# Patient Record
Sex: Male | Born: 1980 | Race: White | Hispanic: No | Marital: Married | Smoking: Never smoker
Health system: Southern US, Community
[De-identification: ages and names within clinical notes are randomized; demographics above are authoritative.]

---

## 2011-01-06 ENCOUNTER — Ambulatory Visit: Payer: Self-pay | Admitting: Medical

## 2013-08-07 ENCOUNTER — Encounter: Payer: Self-pay | Admitting: Podiatry

## 2013-08-07 ENCOUNTER — Ambulatory Visit (INDEPENDENT_AMBULATORY_CARE_PROVIDER_SITE_OTHER): Payer: Managed Care, Other (non HMO) | Admitting: Podiatry

## 2013-08-07 ENCOUNTER — Ambulatory Visit (INDEPENDENT_AMBULATORY_CARE_PROVIDER_SITE_OTHER): Payer: Managed Care, Other (non HMO)

## 2013-08-07 VITALS — BP 111/68 | HR 55 | Resp 16 | Ht 73.0 in | Wt 225.0 lb

## 2013-08-07 DIAGNOSIS — M722 Plantar fascial fibromatosis: Secondary | ICD-10-CM

## 2013-08-07 MED ORDER — TRIAMCINOLONE ACETONIDE 10 MG/ML IJ SUSP
10.0000 mg | Freq: Once | INTRAMUSCULAR | Status: AC
  Administered 2013-08-07: 10 mg

## 2013-08-07 MED ORDER — DICLOFENAC SODIUM 75 MG PO TBEC: 75.0000 mg | DELAYED_RELEASE_TABLET | Freq: Two times a day (BID) | ORAL | Status: DC

## 2013-08-07 NOTE — Progress Notes (Signed)
   Subjective:    Patient ID: Drew [name], male    DOB: 17-May-1980, 33 y.o.   MRN: [id number]  HPI Comments: "I have pain in the heel"  Patient c/o aching plantar heel left for about 1 year. He does have AM pain and worse after a day of activity. He has iced it-no help. Active runner. Did try OTC inserts-some relief.     Review of Systems  All other systems reviewed and are negative.      Objective:   Physical Exam        Assessment & Plan:

## 2013-08-07 NOTE — Progress Notes (Signed)
Subjective:     Patient ID: Drew [name], male   DOB: 1980/05/13, 33 y.o.   MRN: [id number]  Foot Pain   patient states my left heel has been hurting me quite a bit for the last year and makes it difficult to run and do a lot of different types of exercise   Review of Systems  All other systems reviewed and are negative.      Objective:   Physical Exam  Nursing note and vitals reviewed. Constitutional: He is oriented to person, place, and time.  Musculoskeletal: Normal range of motion.  Neurological: He is oriented to person, place, and time.  Skin: Skin is warm.   neurovascular status intact with muscle strength adequate in range of motion subtalar midtarsal joint within normal limits. Patient is found to have exquisite discomfort at the plantar aspect left heel insertion of the tendon into the calcaneus and mild diminishment of the arch height.     Assessment:     Plantar fasciitis of the left heel with acute inflammation noted    Plan:     H&P and x-rays reviewed. Injected the plantar fascia 3 mg Kenalog 5 mg Xylocaine Marcaine mixture and dispensed fascial brace. Placed on diclofenac 75 mg discussed orthotics and reappoint in 1 week

## 2013-08-07 NOTE — Patient Instructions (Signed)

## 2013-08-14 ENCOUNTER — Ambulatory Visit: Payer: Managed Care, Other (non HMO) | Admitting: Podiatry

## 2013-08-21 ENCOUNTER — Ambulatory Visit (INDEPENDENT_AMBULATORY_CARE_PROVIDER_SITE_OTHER): Payer: Managed Care, Other (non HMO) | Admitting: Podiatry

## 2013-08-21 ENCOUNTER — Encounter: Payer: Self-pay | Admitting: Podiatry

## 2013-08-21 VITALS — BP 112/70 | HR 60 | Resp 16

## 2013-08-21 DIAGNOSIS — M722 Plantar fascial fibromatosis: Secondary | ICD-10-CM

## 2013-08-21 NOTE — Progress Notes (Signed)
Subjective:     Patient ID: Drew [name], male   DOB: 1980/04/03, 33 y.o.   MRN: [id number]  HPI patient states that I'm doing a lot better with the pain but I know I need something for the long-term for my foot. Patient's plantar heel is where he points   Review of Systems     Objective:   Physical Exam Neurovascular status intact with significant diminishment of discomfort left plantar heel upon palpation    Assessment:     Plantar fasciitis left still present but improved    Plan:     Reviewed condition and young age of patient. Patient wants to have this worked on and I do agree that we'll be best for him and I scanned today for custom orthotics to reduce stress against the arch and heel

## 2013-09-19 ENCOUNTER — Other Ambulatory Visit: Payer: Managed Care, Other (non HMO)

## 2013-10-02 ENCOUNTER — Ambulatory Visit: Payer: Managed Care, Other (non HMO) | Admitting: Podiatry

## 2013-10-16 ENCOUNTER — Ambulatory Visit (INDEPENDENT_AMBULATORY_CARE_PROVIDER_SITE_OTHER): Payer: Managed Care, Other (non HMO) | Admitting: Podiatry

## 2013-10-16 ENCOUNTER — Encounter: Payer: Self-pay | Admitting: Podiatry

## 2013-10-16 VITALS — BP 123/70 | HR 58 | Resp 16

## 2013-10-16 DIAGNOSIS — M722 Plantar fascial fibromatosis: Secondary | ICD-10-CM

## 2013-10-16 DIAGNOSIS — T148XXA Other injury of unspecified body region, initial encounter: Secondary | ICD-10-CM

## 2013-10-16 NOTE — Progress Notes (Signed)
Subjective:     Patient ID: Drew SheffieldBlayne Slyter, male   DOB: 08/08/1980, 33 y.o.   MRN: [id number]  HPI patient states his foot was doing fine but then he played soccer and felt a pop in his arch and it has been sore ever since especially in the morning and after periods of sitting   Review of Systems     Objective:   Physical Exam Neurovascular status intact with discomfort in the mid arch area left with obvious loss of integrity of the medial fibers of the plantar fascia    Assessment:     Tear of the plantar fascia secondary to plantarflexion injury which he sustained    Plan:     Reviewed condition and recommended night splint in order to stretch and ice and use while sleeping. Gave instructions that this should take 8-12 weeks to heal but should ultimately heal without any problems

## 2015-05-11 LAB — BASIC METABOLIC PANEL
BUN: 23 — AB (ref 4–21)
CREATININE: 1.5 — AB (ref 0.6–1.3)
GLUCOSE: 86
Potassium: 4.1 (ref 3.4–5.3)
Sodium: 139 (ref 137–147)

## 2015-05-11 LAB — HEPATIC FUNCTION PANEL
ALK PHOS: 66 (ref 25–125)
ALT: 19 (ref 10–40)
AST: 26 (ref 14–40)
BILIRUBIN, TOTAL: 1.4

## 2015-05-11 LAB — LIPID PANEL
Cholesterol: 142 (ref 0–200)
HDL: 54 (ref 35–70)
LDL CALC: 77
Triglycerides: 55 (ref 40–160)

## 2016-05-24 LAB — HEPATIC FUNCTION PANEL
ALT: 25 (ref 10–40)
AST: 26 (ref 14–40)
Alkaline Phosphatase: 65 (ref 25–125)
Bilirubin, Total: 1.4

## 2016-05-24 LAB — LIPID PANEL
CHOLESTEROL: 160 (ref 0–200)
HDL: 52 (ref 35–70)
LDL CALC: 86
Triglycerides: 110 (ref 40–160)

## 2016-05-24 LAB — BASIC METABOLIC PANEL
BUN: 16 (ref 4–21)
Creatinine: 1.1 (ref 0.6–1.3)
GLUCOSE: 85
POTASSIUM: 4.1 (ref 3.4–5.3)
Sodium: 142 (ref 137–147)

## 2017-04-13 ENCOUNTER — Ambulatory Visit: Payer: Self-pay | Admitting: Family Medicine

## 2017-04-13 ENCOUNTER — Ambulatory Visit: Payer: Managed Care, Other (non HMO) | Admitting: Family Medicine

## 2017-04-13 ENCOUNTER — Encounter: Payer: Self-pay | Admitting: Family Medicine

## 2017-04-13 VITALS — BP 128/78 | HR 78 | Temp 97.8°F | Ht 73.5 in | Wt 215.0 lb

## 2017-04-13 DIAGNOSIS — G8929 Other chronic pain: Secondary | ICD-10-CM

## 2017-04-13 DIAGNOSIS — M25511 Pain in right shoulder: Secondary | ICD-10-CM

## 2017-04-13 MED ORDER — DICLOFENAC SODIUM 75 MG PO TBEC: 75.0000 mg | DELAYED_RELEASE_TABLET | Freq: Two times a day (BID) | ORAL | 0 refills | Status: DC

## 2017-04-13 NOTE — Progress Notes (Signed)
Subjective:  Drew [name] is a 37 y.o. male who presents today with a chief complaint of shoulder pain and to establish care  HPI:  Shoulder Pain, New Issue  Symptoms started about 2 years ago.  Have worsened within the last few weeks to months.  Patient has remote history of clavicle fracture twice.  One happened when he was a toddler.  The last happened when he was in high school playing football.  Denies any recent injuries or trauma.  Pain located strictly to his right shoulder.  Pain is intermittent in nature.  Notices the pain with specific maneuvers.  He has tried working on home exercise with resistance tubing which has helped a little bit with his pain.  Has also tried ibuprofen which has not significantly seem to help.  No other obvious precipitating events.  No other obvious alleviating or aggravating factors.  ROS: Per HPI, otherwise a 10 point review of systems was performed and was negative  PMH:  The following were reviewed and entered/updated in epic: History reviewed. No pertinent past medical history. There are no active problems to display for this patient.  History reviewed. No pertinent surgical history.  No family history of bone cancer.   Medications- reviewed and updated Current Outpatient Medications  Medication Sig Dispense Refill  . diclofenac (VOLTAREN) 75 MG EC tablet Take 1 tablet (75 mg total) by mouth 2 (two) times daily. 30 tablet 0   No current facility-administered medications for this visit.    Allergies-reviewed and updated No Known Allergies  Social History   Socioeconomic History  . Marital status: Married    Spouse name: None  . Number of children: 4  . Years of education: None  . Highest education level: None  Social Needs  . Financial resource strain: None  . Food insecurity - worry: None  . Food insecurity - inability: None  . Transportation needs - medical: None  . Transportation needs - non-medical: None  Occupational  History  . None  Tobacco Use  . Smoking status: Never Smoker  . Smokeless tobacco: Never Used  Substance and Sexual Activity  . Alcohol use: Yes  . Drug use: No  . Sexual activity: Yes    Partners: Female  Other Topics Concern  . None  Social History Narrative  . None   Objective:  Physical Exam: BP 128/78 (BP Location: Left Arm, Patient Position: Sitting, Cuff Size: Normal)   Pulse 78   Temp 97.8 F (36.6 C) (Oral)   Ht 6' 1.5" (1.867 m)   Wt 215 lb (97.5 kg)   SpO2 99%   BMI 27.98 kg/m   Gen: NAD, resting comfortably CV: RRR with no murmurs appreciated Pulm: NWOB, CTAB with no crackles, wheezes, or rhonchi GI: Normal bowel sounds present. Soft, Nontender, Nondistended. MSK:  -Neck: No deformities.  Nontender to palpation. -Right shoulder: No deformities.  Tender to palpation along anterior and posterior lateral aspects.  Biceps tendon mildly tender to palpation.  AC joint without tenderness on palpation.  Full range of motion however with pain at 90 degrees and greater of extension.  Normal strength with external rotation.  Internal rotation with mildly decreased strength and pain.  Empty can test negative.  O'Brien's test positive.  Speeds test positive.  Crossover test positive.  Neer negative.  Hawking negative. Skin: Warm, dry Neuro: Grossly normal, moves all extremities Psych: Normal affect and thought content  Assessment/Plan:  Right shoulder pain Concern for labral tear and/or biceps tendinitis based  on his physical exam.  He also may have some component of transient mild subluxation.  He has been diligent about rotator cuff strengthening exercises at home-his rotator cuff seems to be intact based on his exam today.  Discussed treatment options with patient.  He is not sure if he is interested in surgery.  We will proceed with conservative management.  Discussed home exercise program focusing on strengthening rotator cuff musculature.  We will also start 2-week course  of diclofenac.  Patient will follow-up with sports medicine within the next couple of weeks for consideration of intra-articular injection.  Advised patient that he may need MRI for definitive diagnosis.  Return precautions reviewed.  Preventative healthcare Patient will return soon for CPE.  Katina Degree. Jimmey Ralph, MD 04/13/2017 10:28 AM

## 2017-04-13 NOTE — Patient Instructions (Signed)
Start the diclofenac.  Work on the exercises.  Please come back to see Dr Berline Choughigby soon.  Take care, Dr Jimmey RalphParker

## 2017-04-16 ENCOUNTER — Ambulatory Visit: Payer: Managed Care, Other (non HMO) | Admitting: Sports Medicine

## 2017-04-20 ENCOUNTER — Ambulatory Visit: Payer: Managed Care, Other (non HMO) | Admitting: Sports Medicine

## 2017-04-25 ENCOUNTER — Ambulatory Visit (INDEPENDENT_AMBULATORY_CARE_PROVIDER_SITE_OTHER): Payer: Managed Care, Other (non HMO) | Admitting: Family Medicine

## 2017-04-25 ENCOUNTER — Ambulatory Visit (INDEPENDENT_AMBULATORY_CARE_PROVIDER_SITE_OTHER): Payer: Managed Care, Other (non HMO)

## 2017-04-25 ENCOUNTER — Ambulatory Visit (INDEPENDENT_AMBULATORY_CARE_PROVIDER_SITE_OTHER): Payer: Managed Care, Other (non HMO) | Admitting: Sports Medicine

## 2017-04-25 ENCOUNTER — Encounter: Payer: Self-pay | Admitting: Sports Medicine

## 2017-04-25 ENCOUNTER — Encounter: Payer: Self-pay | Admitting: Family Medicine

## 2017-04-25 ENCOUNTER — Ambulatory Visit: Payer: Self-pay

## 2017-04-25 VITALS — BP 110/80 | HR 53 | Ht 73.5 in | Wt 206.0 lb

## 2017-04-25 VITALS — BP 128/80 | HR 63 | Temp 98.2°F | Ht 73.5 in | Wt 206.2 lb

## 2017-04-25 DIAGNOSIS — G8929 Other chronic pain: Secondary | ICD-10-CM

## 2017-04-25 DIAGNOSIS — M25511 Pain in right shoulder: Principal | ICD-10-CM

## 2017-04-25 DIAGNOSIS — M19011 Primary osteoarthritis, right shoulder: Secondary | ICD-10-CM | POA: Diagnosis not present

## 2017-04-25 DIAGNOSIS — Z0000 Encounter for general adult medical examination without abnormal findings: Secondary | ICD-10-CM

## 2017-04-25 DIAGNOSIS — R634 Abnormal weight loss: Secondary | ICD-10-CM

## 2017-04-25 DIAGNOSIS — Z114 Encounter for screening for human immunodeficiency virus [HIV]: Secondary | ICD-10-CM

## 2017-04-25 DIAGNOSIS — Z13220 Encounter for screening for lipoid disorders: Secondary | ICD-10-CM

## 2017-04-25 DIAGNOSIS — M1990 Unspecified osteoarthritis, unspecified site: Secondary | ICD-10-CM | POA: Insufficient documentation

## 2017-04-25 LAB — LIPID PANEL
Cholesterol: 104 mg/dL (ref 0–200)
HDL: 36.7 mg/dL — ABNORMAL LOW (ref >39.00)
LDL CALC: 50 mg/dL (ref 0–99)
NONHDL: 67.5
Total CHOL/HDL Ratio: 3
Triglycerides: 88 mg/dL (ref 0.0–149.0)
VLDL: 17.6 mg/dL (ref 0.0–40.0)

## 2017-04-25 LAB — TSH: TSH: 1.29 u[IU]/mL (ref 0.35–4.50)

## 2017-04-25 LAB — CBC
HCT: 41.9 % (ref 39.0–52.0)
Hemoglobin: 14.3 g/dL (ref 13.0–17.0)
MCHC: 34.2 g/dL (ref 30.0–36.0)
MCV: 93.9 fl (ref 78.0–100.0)
Platelets: 214 10*3/uL (ref 150.0–400.0)
RBC: 4.46 Mil/uL (ref 4.22–5.81)
RDW: 12.9 % (ref 11.5–15.5)
WBC: 3.4 10*3/uL — ABNORMAL LOW (ref 4.0–10.5)

## 2017-04-25 LAB — COMPREHENSIVE METABOLIC PANEL
ALT: 19 U/L (ref 0–53)
AST: 20 U/L (ref 0–37)
Albumin: 4.3 g/dL (ref 3.5–5.2)
Alkaline Phosphatase: 53 U/L (ref 39–117)
BUN: 14 mg/dL (ref 6–23)
CALCIUM: 9.3 mg/dL (ref 8.4–10.5)
CHLORIDE: 106 meq/L (ref 96–112)
CO2: 29 meq/L (ref 19–32)
CREATININE: 1.09 mg/dL (ref 0.40–1.50)
GFR: 80.6 mL/min (ref >60.00)
GLUCOSE: 92 mg/dL (ref 70–99)
Potassium: 4.3 mEq/L (ref 3.5–5.1)
SODIUM: 141 meq/L (ref 135–145)
Total Bilirubin: 0.7 mg/dL (ref 0.2–1.2)
Total Protein: 6.6 g/dL (ref 6.0–8.3)

## 2017-04-25 NOTE — Patient Instructions (Signed)

## 2017-04-25 NOTE — Patient Instructions (Signed)
Preventive Care 18-39 Years, Male Preventive care refers to lifestyle choices and visits with your health care provider that can promote health and wellness. What does preventive care include?  A yearly physical exam. This is also called an annual well check.  Dental exams once or twice a year.  Routine eye exams. Ask your health care provider how often you should have your eyes checked.  Personal lifestyle choices, including: ? Daily care of your teeth and gums. ? Regular physical activity. ? Eating a healthy diet. ? Avoiding tobacco and drug use. ? Limiting alcohol use. ? Practicing safe sex. What happens during an annual well check? The services and screenings done by your health care provider during your annual well check will depend on your age, overall health, lifestyle risk factors, and family history of disease. Counseling Your health care provider may ask you questions about your:  Alcohol use.  Tobacco use.  Drug use.  Emotional well-being.  Home and relationship well-being.  Sexual activity.  Eating habits.  Work and work Statistician.  Screening You may have the following tests or measurements:  Height, weight, and BMI.  Blood pressure.  Lipid and cholesterol levels. These may be checked every 5 years starting at age 34.  Diabetes screening. This is done by checking your blood sugar (glucose) after you have not eaten for a while (fasting).  Skin check.  Hepatitis C blood test.  Hepatitis B blood test.  Sexually transmitted disease (STD) testing.  Discuss your test results, treatment options, and if necessary, the need for more tests with your health care provider. Vaccines Your health care provider may recommend certain vaccines, such as:  Influenza vaccine. This is recommended every year.  Tetanus, diphtheria, and acellular pertussis (Tdap, Td) vaccine. You may need a Td booster every 10 years.  Varicella vaccine. You may need this if you  have not been vaccinated.  HPV vaccine. If you are 23 or younger, you may need three doses over 6 months.  Measles, mumps, and rubella (MMR) vaccine. You may need at least one dose of MMR.You may also need a second dose.  Pneumococcal 13-valent conjugate (PCV13) vaccine. You may need this if you have certain conditions and have not been vaccinated.  Pneumococcal polysaccharide (PPSV23) vaccine. You may need one or two doses if you smoke cigarettes or if you have certain conditions.  Meningococcal vaccine. One dose is recommended if you are age 65-21 years and a first-year college student living in a residence hall, or if you have one of several medical conditions. You may also need additional booster doses.  Hepatitis A vaccine. You may need this if you have certain conditions or if you travel or work in places where you may be exposed to hepatitis A.  Hepatitis B vaccine. You may need this if you have certain conditions or if you travel or work in places where you may be exposed to hepatitis B.  Haemophilus influenzae type b (Hib) vaccine. You may need this if you have certain risk factors.  Talk to your health care provider about which screenings and vaccines you need and how often you need them. This information is not intended to replace advice given to you by your health care provider. Make sure you discuss any questions you have with your health care provider. Document Released: 04/25/2001 Document Revised: 11/17/2015 Document Reviewed: 12/29/2014 Elsevier Interactive Patient Education  [employer].

## 2017-04-25 NOTE — Procedures (Signed)
PROCEDURE NOTE:  Ultrasound Guided: Injection: Right shoulder Images were obtained and interpreted by myself, Gaspar BiddingMichael Zekiah Coen, DO  Images have been saved and stored to PACS system. Images obtained on: GE S7 Ultrasound machine  ULTRASOUND FINDINGS:  Biceps tendon is intact.  Well-defined musculature including within all rotator cuff musculature.  Small amount of degenerative spurring, questionable small posterior labral tear incompletely visualized on MSK ultrasound  DESCRIPTION OF PROCEDURE:  The patient's clinical condition is marked by substantial pain and/or significant functional disability. Other conservative therapy has not provided relief, is contraindicated, or not appropriate. There is a reasonable likelihood that injection will significantly improve the patient's pain and/or functional impairment.  After discussing the risks, benefits and expected outcomes of the injection and all questions were reviewed and answered, the patient wished to undergo the above named procedure. Verbal consent was obtained.  The ultrasound was used to identify the target structure and adjacent neurovascular structures. The skin was then prepped in sterile fashion and the target structure was injected under direct visualization using sterile technique as below:   PREP: Alcohol, Ethel Chloride,   APPROACH: posterior, single injection, 21g 2in. INJECTATE: 2cc: 0.5% marcaine, 2cc: 40mg /mL DepoMedrol   ASPIRATE: None   DRESSING: Band-Aid   Post procedural instructions including recommending icing and warning signs for infection were reviewed.   This procedure was well tolerated and there were no complications.     IMPRESSION: Succesful Ultrasound Guided: Injection

## 2017-04-25 NOTE — Progress Notes (Signed)
Veverly FellsMichael D. Delorise Shinerigby, DO  [facility] [place] [phone]  Drew SheffieldBlayne Duve - 37 y.o. male MRN [id number]  Date of birth: 10/23/1980  Visit Date: 04/25/2017  PCP: Ardith DarkParker, Caleb M, MD   Referred by: Ardith DarkParker, Caleb M, MD   Scribe for today's visit: Stevenson ClinchBrandy Coleman, CMA     SUBJECTIVE:  Drew [name] is here for No chief complaint on file.  Marland Kitchen.[email or web address] RT shoulder pain symptoms INITIALLY: Began about 2 yrs ago but has gotten worse over the past 1-2 months. No recent injury/truama. He is LT hand dominant.  Described as moderate stabbing, nonradiating Worsened with rotation, rolling over in bed, picking up heavy objects.  Improved with rest. Additional associated symptoms include: Pain is mostly posterior. He hears and feels clicking and popping in the shoulder with movement. He denies decreased grip strength.     At this time symptoms are worsening compared to onset  He has been taking IBU with no relief. He was prescribed Diclofenac by Dr. Jimmey RalphParker but has not started it. He was also provided with home therapeutic exercises which don't seem to be helping.   No recent xray of neck or shoulder.    ROS Reports night time disturbances. Denies fevers, chills, or night sweats. Denies unexplained weight loss. Denies personal history of cancer. Denies changes in bowel or bladder habits. Denies recent unreported falls. Denies new or worsening dyspnea or wheezing. Denies headaches or dizziness.  Denies numbness, tingling or weakness  In the extremities.  Denies dizziness or presyncopal episodes Denies lower extremity edema     HISTORY & PERTINENT PRIOR DATA:  Prior History reviewed and updated per electronic medical record.  Significant history, findings, studies and interim changes include:  reports that  has never smoked. he has never used smokeless tobacco. No results for input(s): HGBA1C, LABURIC, CREATINE in the last 8760 hours. Left hand  dominant Problem  Osteoarthritis of Right Shoulder   Steroid injection on 04/25/2017 Prior clavicle fracture playing football but no other known recurrent dislocation/relocation injuries. If any lack of improvement will need further diagnostic evaluation with MR arthrogram of the shoulder.     OBJECTIVE:  VS:  HT:6' 1.5" (186.7 cm)   WT:206 lb (93.4 kg)  BMI:26.81    BP:110/80  HR:(!) 53bpm  TEMP: ( )  RESP:98 %   PHYSICAL EXAM: Constitutional: WDWN, Non-toxic appearing. Psychiatric: Alert & appropriately interactive.  Not depressed or anxious appearing. Respiratory: No increased work of breathing.  Trachea Midline Eyes: Pupils are equal.  EOM intact without nystagmus.  No scleral icterus  NEUROVASCULAR exam: No clubbing or cyanosis appreciated No significant venous stasis changes Capillary Refill: normal, less than 2 seconds   Right Shoulder Exam: Normal alignment & Contours Skin: No overlying erythema/ecchymosis Neck: normal range of motion and supple Axial loading and circumduction produces: Small amount of crepitation and small amount of pain especially AB adducted and externally rotated position  Non tender to palpation over: Bony Landmarks TTP over: none Drop arm test: negative Hawkins: normal, no pain  Neers: normal, no pain   Internal Rotation:  ROM: Normal with no pain Strength: Normal External Rotation:  ROM: Normal with no pain Strength: Normal  Empty can: normal, no pain Strength: Normal Speeds: normal, no pain Strength: Normal O'Briens: normal, no pain Strength: Normal  No additional findings.   ASSESSMENT & PLAN:   1. Chronic right shoulder pain   2. Osteoarthritis of right shoulder, unspecified osteoarthritis type    PLAN:  Osteoarthritis of right shoulder Steroid injection today Prior clavicle fracture playing football but no other known recurrent dislocation/relocation injuries. If any lack of improvement will need further diagnostic  evaluation with MR arthrogram of the shoulder Possibly underlying labral tear   ++++++++++++++++++++++++++++++++++++++++++++ Orders & Meds: Orders Placed This Encounter  Procedures  . DG Shoulder Right  . [place] MSK POCT ULTRASOUND    No orders of the defined types were placed in this encounter.   ++++++++++++++++++++++++++++++++++++++++++++ Follow-up: Return in about 6 weeks (around 06/06/2017).   Pertinent documentation may be included in additional procedure notes, imaging studies, problem based documentation and patient instructions. Please see these sections of the encounter for additional information regarding this visit. CMA/ATC served as Neurosurgeon during this visit. History, Physical, and Plan performed by medical provider. Documentation and orders reviewed and attested to.      Andrena Mews, DO    [facility] Physician

## 2017-04-25 NOTE — Assessment & Plan Note (Signed)
Steroid injection today Prior clavicle fracture playing football but no other known recurrent dislocation/relocation injuries. If any lack of improvement will need further diagnostic evaluation with MR arthrogram of the shoulder Possibly underlying labral tear

## 2017-04-25 NOTE — Progress Notes (Signed)
Subjective:  Drew [name] is a 37 y.o. male who presents today for his annual comprehensive physical exam.    HPI:  He has no acute complaints today.   Lifestyle Diet: Trying to stay away from fast food.  Exercise: Works out 5-6 days per week. Cardio and weight lifting.   Depression screen PHQ 2/9 04/25/2017  Decreased Interest 0  Down, Depressed, Hopeless 0  PHQ - 2 Score 0   Health Maintenance Due  Topic Date Due  . HIV Screening  08/19/1994    ROS: A 10 point review of systems was performed and was negative  PMH:  The following were reviewed and entered/updated in epic: History reviewed. No pertinent past medical history. There are no active problems to display for this patient.  History reviewed. No pertinent surgical history.  No FH of hypertension.   Medications- reviewed and updated No current outpatient medications on file.   No current facility-administered medications for this visit.    Allergies-reviewed and updated No Known Allergies  Social History   Socioeconomic History  . Marital status: Married    Spouse name: None  . Number of children: 4  . Years of education: None  . Highest education level: None  Social Needs  . Financial resource strain: None  . Food insecurity - worry: None  . Food insecurity - inability: None  . Transportation needs - medical: None  . Transportation needs - non-medical: None  Occupational History  . None  Tobacco Use  . Smoking status: Never Smoker  . Smokeless tobacco: Never Used  Substance and Sexual Activity  . Alcohol use: Yes  . Drug use: No  . Sexual activity: Yes    Partners: Female  Other Topics Concern  . None  Social History Narrative  . None    Objective:  Physical Exam: BP 128/80 (BP Location: Left Arm, Patient Position: Sitting, Cuff Size: Normal)   Pulse 63   Temp 98.2 F (36.8 C) (Oral)   Ht 6' 1.5" (1.867 m)   Wt 206 lb 3.2 oz (93.5 kg)   SpO2 98%   BMI 26.84 kg/m   Body mass  index is 26.84 kg/m. Wt Readings from Last 3 Encounters:  04/25/17 206 lb 3.2 oz (93.5 kg)  04/13/17 215 lb (97.5 kg)  08/07/13 225 lb (102.1 kg)   Gen: NAD, resting comfortably HEENT: TMs normal bilaterally. OP clear. No thyromegaly noted.  CV: RRR with no murmurs appreciated Pulm: NWOB, CTAB with no crackles, wheezes, or rhonchi GI: Normal bowel sounds present. Soft, Nontender, Nondistended. MSK: no edema, cyanosis, or clubbing noted Skin: warm, dry Neuro: CN2-12 grossly intact. Strength 5/5 in upper and lower extremities. Reflexes symmetric and intact bilaterally.  Psych: Normal affect and thought content  Assessment/Plan:  Weight Loss Patient down about 9lb over the last 2 weeks. Check CBC, CMET, TSH  Preventative Healthcare: Check lipid panel. Check HIV antibody.   Patient Counseling:  -Nutrition: Stressed importance of moderation in sodium/caffeine intake, saturated fat and cholesterol, caloric balance, sufficient intake of fresh fruits, vegetables, and fiber.  -Stressed the importance of regular exercise.   -Substance Abuse: Discussed cessation/primary prevention of tobacco, alcohol, or other drug use; driving or other dangerous activities under the influence; availability of treatment for abuse.   -Injury prevention: Discussed safety belts, safety helmets, smoke detector, smoking near bedding or upholstery.   -Sexuality: Discussed sexually transmitted diseases, partner selection, use of condoms, avoidance of unintended pregnancy and contraceptive alternatives.   -Dental health: Discussed importance  of regular tooth brushing, flossing, and dental visits.  -Health maintenance and immunizations reviewed. Please refer to Health maintenance section.  Return to care in 1 year for next preventative visit.   Katina Degree. Jimmey Ralph, MD 04/25/2017 9:19 AM

## 2017-04-26 ENCOUNTER — Encounter: Payer: Self-pay | Admitting: Family Medicine

## 2017-04-26 LAB — HIV ANTIBODY (ROUTINE TESTING W REFLEX): HIV 1&2 Ab, 4th Generation: NONREACTIVE

## 2017-04-26 NOTE — Progress Notes (Signed)
Findings are consistent with osteoarthritis of the right shoulder that is mild to moderate

## 2017-06-06 ENCOUNTER — Ambulatory Visit (INDEPENDENT_AMBULATORY_CARE_PROVIDER_SITE_OTHER): Payer: Managed Care, Other (non HMO) | Admitting: Sports Medicine

## 2017-06-06 ENCOUNTER — Encounter: Payer: Self-pay | Admitting: Sports Medicine

## 2017-06-06 VITALS — BP 102/68 | HR 65 | Ht 73.5 in | Wt 208.4 lb

## 2017-06-06 DIAGNOSIS — M19011 Primary osteoarthritis, right shoulder: Secondary | ICD-10-CM

## 2017-06-06 DIAGNOSIS — M25511 Pain in right shoulder: Secondary | ICD-10-CM

## 2017-06-06 DIAGNOSIS — G8929 Other chronic pain: Secondary | ICD-10-CM | POA: Diagnosis not present

## 2017-06-06 NOTE — Assessment & Plan Note (Signed)
He has noted good improvement over the past several weeks with decreasing his overhead lifting activities.  Continuing with activity modifications at this time and he will plan to follow-up with us as needed if recurrence of significant symptoms otherwise further diagnostic evaluation will be deferred given that he would like to avoid surgery if possible and there are no red flag symptoms.

## 2017-06-06 NOTE — Progress Notes (Signed)
Drew FellsMichael D. Drew Shinerigby, DO  [facility] [place] [phone]  Drew SheffieldBlayne [name] - 37 y.o. male MRN [id number]  Date of birth: 09/07/1980  Visit Date: 06/06/2017  PCP: Drew [name], Drew [name], Drew [name]   Referred by: Drew [name], Drew [name], Drew [name]  Scribe for today's visit: Drew [name], CMA     SUBJECTIVE:  Drew SheffieldBlayne [name] is here for Follow-up (R shoulder pain)  04/25/17: His RT shoulder pain symptoms INITIALLY: Began about 2 yrs ago but has gotten worse over the past 1-2 months. No recent injury/truama. He is LT hand dominant.  Described as moderate stabbing, nonradiating Worsened with rotation, rolling over in bed, picking up heavy objects.  Improved with rest. Additional associated symptoms include: Pain is mostly posterior. He hears and feels clicking and popping in the shoulder with movement. He denies decreased grip strength.  At this time symptoms are worsening compared to onset  He has been taking IBU with no relief. He was prescribed Diclofenac by Dr. Jimmey RalphParker but has not started it. He was also provided with home therapeutic exercises which don't seem to be helping.  No recent xray of neck or shoulder.   06/06/17: Compared to the last office visit, his previously described symptoms show no change. He had initial improvement after injection but still has pain when he uses the arm.  Current symptoms are moderate & are nonradiating He had previously tried IBU and Diclofenac with minimal relief. He has been doing home exercises as a warm up prior to working out. He had steroid injection 04/25/17 and got some relief.   ROS Denies night time disturbances. Denies fevers, chills, or night sweats. Denies unexplained weight loss. Denies personal history of cancer. Denies changes in bowel or bladder habits. Denies recent unreported falls. Denies new or worsening dyspnea or wheezing. Denies headaches or dizziness.  Denies numbness, tingling or weakness  In the extremities.   Denies dizziness or presyncopal episodes Denies lower extremity edema    HISTORY & PERTINENT PRIOR DATA:  Prior History reviewed and updated per electronic medical record.  Significant/pertinent history, findings, studies include:  reports that he has never smoked. He has never used smokeless tobacco. No results for input(s): HGBA1C, LABURIC, CREATINE in the last 8760 hours. Left hand dominant Problem  Osteoarthritis of Right Shoulder   Steroid injection on 04/25/2017 Prior clavicle fracture playing football but no other known recurrent dislocation/relocation injuries. If any lack of improvement will need further diagnostic evaluation with MR arthrogram of the shoulder.     OBJECTIVE:  VS:  HT:6' 1.5" (186.7 cm)   WT:208 lb 6.4 oz (94.5 kg)  BMI:27.12    BP:102/68  HR:65bpm  TEMP: ( )  RESP:99 %   PHYSICAL EXAM: Constitutional: WDWN, Non-toxic appearing. Psychiatric: Alert & appropriately interactive.  Not depressed or anxious appearing. Respiratory: No increased work of breathing.  Trachea Midline Eyes: Pupils are equal.  EOM intact without nystagmus.  No scleral icterus  VASCULAR: warm to touch Radial Pulses: normal upper extremity neuro exam: unremarkable  Is a shoulder is held in protraction quite significantly bilaterally.  He does have full overhead range of motion.  No significant crepitation with axial load and circumduction.  Strength with internal rotation, empty can, speeds testing and O'Brien's testing is all 5 out of 5 with no pain today.  He has normal range of motion of his neck.  No radicular symptoms.     ASSESSMENT & PLAN:   1. Chronic right shoulder pain   2.  Osteoarthritis of right shoulder, unspecified osteoarthritis type     PLAN:       Follow-up: Return if symptoms worsen or fail to improve, for as needed for ongoing issues.   Osteoarthritis of right shoulder He has noted good improvement over the past several weeks with decreasing his  overhead lifting activities.  Continuing with activity modifications at this time and he will plan to follow-up with [place] as needed if recurrence of significant symptoms otherwise further diagnostic evaluation will be deferred given that he would like to avoid surgery if possible and there are no red flag symptoms.    Please see additional documentation for Objective, Assessment and Plan sections. Pertinent additional documentation may be included in corresponding procedure notes, imaging studies, problem based documentation and patient instructions. Please see these sections of the encounter for additional information regarding this visit.  CMA/ATC served as Neurosurgeon during this visit. History, Physical, and Plan performed by medical provider. Documentation and orders reviewed and attested to.      Drew Mews, DO    [facility] Physician

## 2017-09-27 ENCOUNTER — Ambulatory Visit: Payer: Managed Care, Other (non HMO) | Admitting: Family Medicine

## 2017-09-27 ENCOUNTER — Encounter: Payer: Self-pay | Admitting: Family Medicine

## 2017-09-27 VITALS — BP 116/64 | HR 78 | Temp 97.8°F | Ht 74.0 in | Wt 204.6 lb

## 2017-09-27 DIAGNOSIS — L237 Allergic contact dermatitis due to plants, except food: Secondary | ICD-10-CM | POA: Diagnosis not present

## 2017-09-27 MED ORDER — PREDNISONE 20 MG PO TABS: ORAL_TABLET | ORAL | 0 refills | Status: AC

## 2017-09-27 NOTE — Progress Notes (Signed)
   Subjective:  Drew [name] is a 37 y.o. male who presents today for same-day appointment with a chief complaint of rash.   HPI:  Rash, Acute problem Started a few weeks ago. Worsened over that time.  He was clearing brush and thinks he was exposed to poison ivy.  Rash initially on his left arm however progressed to his trunk and right arm.  Also some of his genital areas.  He hass tried calamine lotion and treat heal well without significant relief.  No other obvious alleviating or aggravating factors.  ROS: Per HPI  PMH: He reports that he has never smoked. He has never used smokeless tobacco. He reports that he drinks alcohol. He reports that he does not use drugs.  Objective:  Physical Exam: BP 116/64 (BP Location: Right Arm, Patient Position: Sitting, Cuff Size: Normal)   Pulse 78   Temp 97.8 F (36.6 C) (Oral)   Ht 6\' 2"  (1.88 m)   Wt 204 lb 9.6 oz (92.8 kg)   SpO2 98%   BMI 26.27 kg/m   Gen: NAD, resting comfortably Skin: Erythematous, vesicular rash on left upper extremity, right upper extremity, and abdomen.  Assessment/Plan:  Poison ivy dermatitis Given widespread distribution, we will start prednisone taper.  Also recommended use of over-the-counter hydrocortisone cream to the areas on extremities and trunk.  Discussed reasons to return to care.  Follow-up as needed.  Katina Degreealeb M. Jimmey RalphParker, MD 09/27/2017 1:25 PM

## 2017-09-27 NOTE — Patient Instructions (Signed)
It was very nice to see you today!  Please start the prednisone.  Take 2 pills daily for the first 7 days, then 1 pill daily for the next 3 days.  You can also use over-the-counter hydrocortisone cream to the areas on the arms and abdomen.  Please let me know if your symptoms worsen or do not improve with medications.  Take care, Dr Jimmey RalphParker

## 2018-06-26 ENCOUNTER — Telehealth: Payer: Self-pay | Admitting: Family Medicine

## 2018-06-26 NOTE — Telephone Encounter (Signed)
Called pt and left vm informing of doxy

## 2018-07-01 ENCOUNTER — Telehealth: Payer: Self-pay | Admitting: Family Medicine

## 2018-07-01 NOTE — Telephone Encounter (Signed)
Patient called requesting a call back because he needs to speak with someone about his poison oak. [phone] (mobile)

## 2018-07-01 NOTE — Telephone Encounter (Signed)
Schedule doxy appt to speak to Dr. Elisha Headland to discuss

## 2018-12-02 IMAGING — DX DG SHOULDER 2+V*R*
3 series · 3 of 3 positions shown · non-contrast
Comparison: None.

CLINICAL DATA: Chronic right shoulder pain.

EXAM:
RIGHT SHOULDER - 2+ VIEW

[shoulder grashey ap]
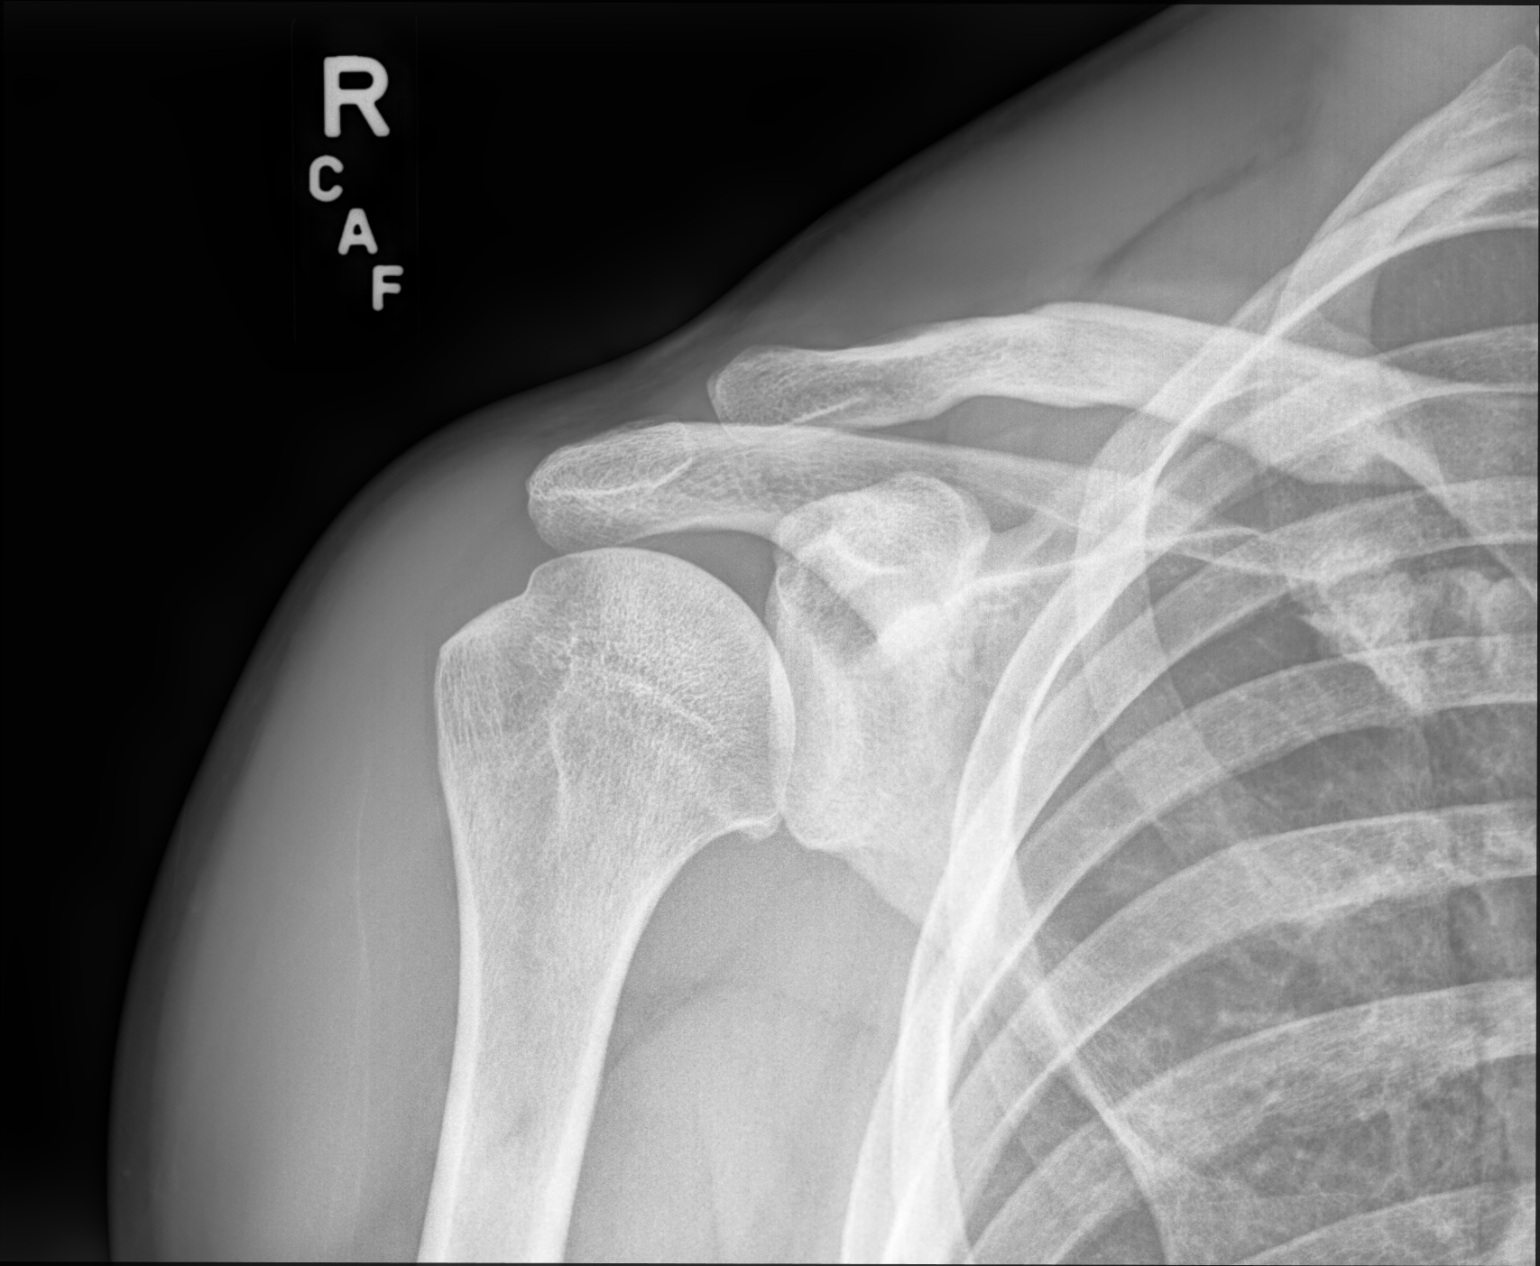

[shoulder y view]
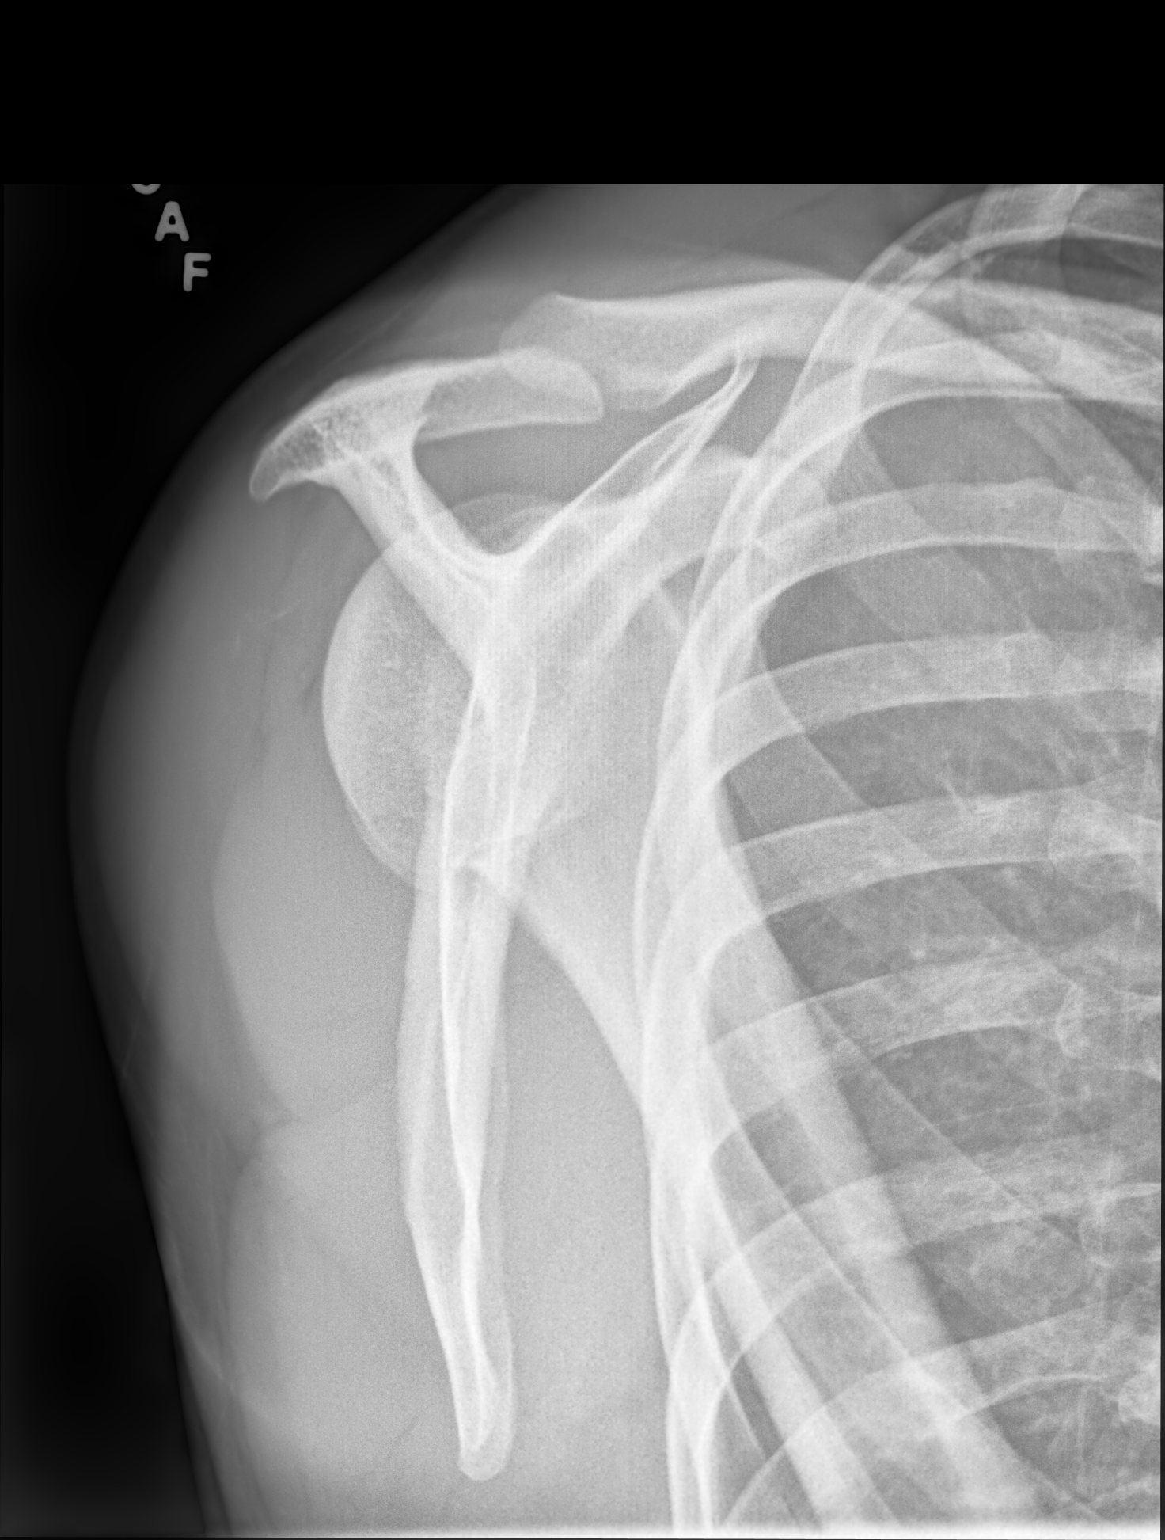

[shoulder axial]
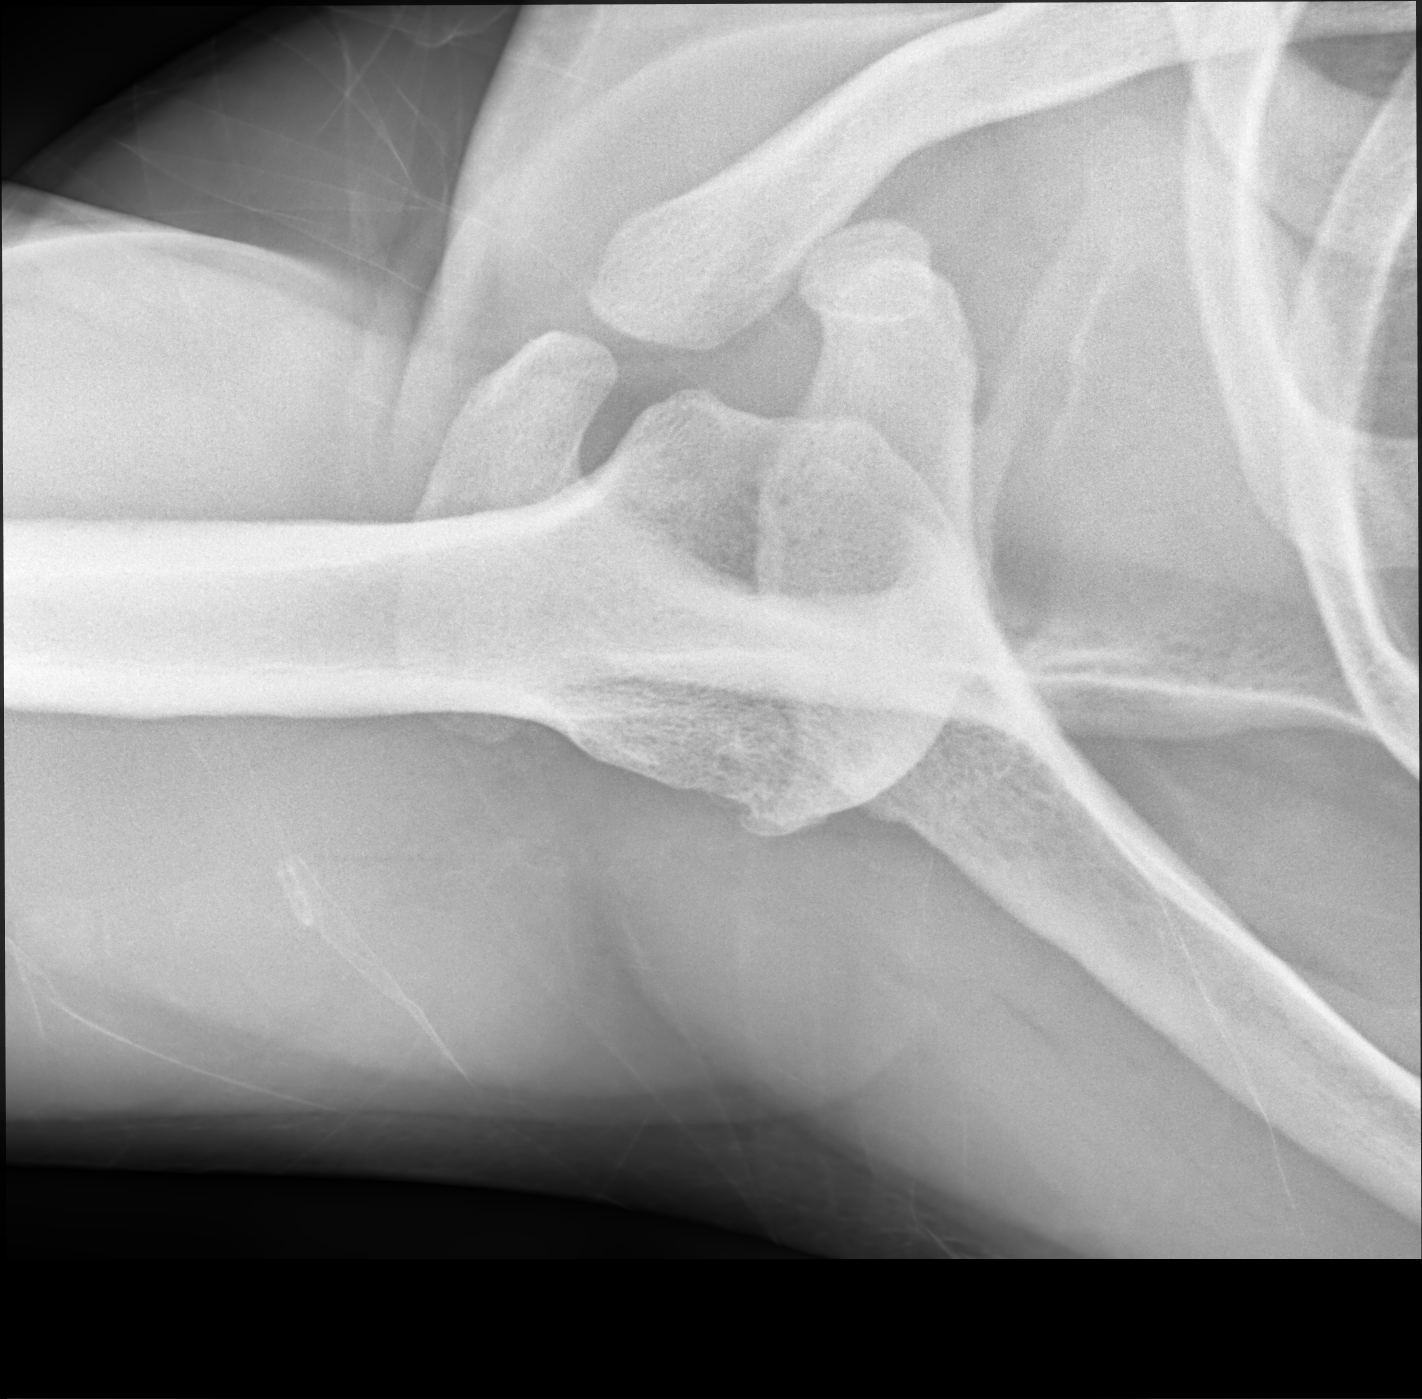

[3 of 3 positions shown; findings below may reference images not displayed]

FINDINGS: The joint spaces are maintained. Possible minimal/early spurring
change. No acute bony findings or bone lesion. No abnormal soft
tissue calcifications. The visualized lung is clear and the
visualized ribs are intact.
IMPRESSION: No acute bony findings.

Suspect early spurring change.

## 2019-05-26 ENCOUNTER — Other Ambulatory Visit: Payer: Self-pay

## 2019-05-26 ENCOUNTER — Encounter: Payer: Self-pay | Admitting: Family Medicine

## 2019-05-26 ENCOUNTER — Ambulatory Visit (INDEPENDENT_AMBULATORY_CARE_PROVIDER_SITE_OTHER): Payer: Managed Care, Other (non HMO) | Admitting: Family Medicine

## 2019-05-26 VITALS — BP 118/70 | HR 78 | Temp 98.2°F | Ht 74.0 in | Wt 221.0 lb

## 2019-05-26 DIAGNOSIS — E663 Overweight: Secondary | ICD-10-CM

## 2019-05-26 DIAGNOSIS — B360 Pityriasis versicolor: Secondary | ICD-10-CM

## 2019-05-26 DIAGNOSIS — Z0001 Encounter for general adult medical examination with abnormal findings: Secondary | ICD-10-CM

## 2019-05-26 DIAGNOSIS — Z6828 Body mass index (BMI) 28.0-28.9, adult: Secondary | ICD-10-CM | POA: Diagnosis not present

## 2019-05-26 MED ORDER — KETOCONAZOLE 2 % EX SHAM: 1.0000 "application " | MEDICATED_SHAMPOO | CUTANEOUS | 0 refills | Status: DC

## 2019-05-26 NOTE — Assessment & Plan Note (Signed)
Stable.  Refilled ketoconazole.

## 2019-05-26 NOTE — Patient Instructions (Signed)
It was very nice to see you today!  We will send in ketoconazole for your tinea versicolor.  No other changes today.  Please come back in year for your next physical, or sooner if needed.  Take care, Dr Jerline Pain  Please try these tips to maintain a healthy lifestyle:   Eat at least 3 REAL meals and 1-2 snacks per day.  Aim for no more than 5 hours between eating.  If you eat breakfast, please do so within one hour of getting up.    Each meal should contain half fruits/vegetables, one quarter protein, and one quarter carbs (no bigger than a computer mouse)   Cut down on sweet beverages. This includes juice, soda, and sweet tea.     Drink at least 1 glass of water with each meal and aim for at least 8 glasses per day   Exercise at least 150 minutes every week.    Preventive Care 40-40 Years Old, Male Preventive care refers to lifestyle choices and visits with your health care provider that can promote health and wellness. This includes:  A yearly physical exam. This is also called an annual well check.  Regular dental and eye exams.  Immunizations.  Screening for certain conditions.  Healthy lifestyle choices, such as eating a healthy diet, getting regular exercise, not using drugs or products that contain nicotine and tobacco, and limiting alcohol use. What can I expect for my preventive care visit? Physical exam Your health care provider will check:  Height and weight. These may be used to calculate body mass index (BMI), which is a measurement that tells if you are at a healthy weight.  Heart rate and blood pressure.  Your skin for abnormal spots. Counseling Your health care provider may ask you questions about:  Alcohol, tobacco, and drug use.  Emotional well-being.  Home and relationship well-being.  Sexual activity.  Eating habits.  Work and work Statistician. What immunizations do I need?  Influenza (flu) vaccine  This is recommended every  year. Tetanus, diphtheria, and pertussis (Tdap) vaccine  You may need a Td booster every 10 years. Varicella (chickenpox) vaccine  You may need this vaccine if you have not already been vaccinated. Human papillomavirus (HPV) vaccine  If recommended by your health care provider, you may need three doses over 6 months. Measles, mumps, and rubella (MMR) vaccine  You may need at least one dose of MMR. You may also need a second dose. Meningococcal conjugate (MenACWY) vaccine  One dose is recommended if you are 12-64 years old and a Market researcher living in a residence hall, or if you have one of several medical conditions. You may also need additional booster doses. Pneumococcal conjugate (PCV13) vaccine  You may need this if you have certain conditions and were not previously vaccinated. Pneumococcal polysaccharide (PPSV23) vaccine  You may need one or two doses if you smoke cigarettes or if you have certain conditions. Hepatitis A vaccine  You may need this if you have certain conditions or if you travel or work in places where you may be exposed to hepatitis A. Hepatitis B vaccine  You may need this if you have certain conditions or if you travel or work in places where you may be exposed to hepatitis B. Haemophilus influenzae type b (Hib) vaccine  You may need this if you have certain risk factors. You may receive vaccines as individual doses or as more than one vaccine together in one shot (combination vaccines). Talk with  your health care provider about the risks and benefits of combination vaccines. What tests do I need? Blood tests  Lipid and cholesterol levels. These may be checked every 5 years starting at age 26.  Hepatitis C test.  Hepatitis B test. Screening   Diabetes screening. This is done by checking your blood sugar (glucose) after you have not eaten for a while (fasting).  Sexually transmitted disease (STD) testing. Talk with your health care  provider about your test results, treatment options, and if necessary, the need for more tests. Follow these instructions at home: Eating and drinking   Eat a diet that includes fresh fruits and vegetables, whole grains, lean protein, and low-fat dairy products.  Take vitamin and mineral supplements as recommended by your health care provider.  Do not drink alcohol if your health care provider tells you not to drink.  If you drink alcohol: ? Limit how much you have to 0-2 drinks a day. ? Be aware of how much alcohol is in your drink. In the [place] one drink equals one 12 oz bottle of beer (355 mL), one 5 oz glass of wine (148 mL), or one 1 oz glass of hard liquor (44 mL). Lifestyle  Take daily care of your teeth and gums.  Stay active. Exercise for at least 30 minutes on 5 or more days each week.  Do not use any products that contain nicotine or tobacco, such as cigarettes, e-cigarettes, and chewing tobacco. If you need help quitting, ask your health care provider.  If you are sexually active, practice safe sex. Use a condom or other form of protection to prevent STIs (sexually transmitted infections). What's next?  Go to your health care provider once a year for a well check visit.  Ask your health care provider how often you should have your eyes and teeth checked.  Stay up to date on all vaccines. This information is not intended to replace advice given to you by your health care provider. Make sure you discuss any questions you have with your health care provider. Document Revised: 02/21/2018 Document Reviewed: 02/21/2018 Elsevier Patient Education  2020 [employer].

## 2019-05-26 NOTE — Progress Notes (Signed)
Chief Complaint:  Drew [name] is a 39 y.o. male who presents today for his annual comprehensive physical exam.    Assessment/Plan:  Chronic Problems Addressed Today: Tinea versicolor Stable.  Refilled ketoconazole.  Body mass index is 28.37 kg/m. / Overweight BMI Metric Follow Up - 05/26/19 1450      BMI Metric Follow Up-Please document annually   BMI Metric Follow Up  Education provided       Preventative Healthcare: UTD on vaccines and screenings.   Patient Counseling(The following topics were reviewed and/or handout was given):  -Nutrition: Stressed importance of moderation in sodium/caffeine intake, saturated fat and cholesterol, caloric balance, sufficient intake of fresh fruits, vegetables, and fiber.  -Stressed the importance of regular exercise.   -Substance Abuse: Discussed cessation/primary prevention of tobacco, alcohol, or other drug use; driving or other dangerous activities under the influence; availability of treatment for abuse.   -Injury prevention: Discussed safety belts, safety helmets, smoke detector, smoking near bedding or upholstery.   -Sexuality: Discussed sexually transmitted diseases, partner selection, use of condoms, avoidance of unintended pregnancy and contraceptive alternatives.   -Dental health: Discussed importance of regular tooth brushing, flossing, and dental visits.  -Health maintenance and immunizations reviewed. Please refer to Health maintenance section.  Return to care in 1 year for next preventative visit.     Subjective:  HPI:  He has no acute complaints today.   Lifestyle Diet: None specific.  Exercise: Limited due to covid over the past year.   Depression screen [facility] 2/9 05/26/2019  Decreased Interest 0  Down, Depressed, Hopeless 0  PHQ - 2 Score 0  Altered sleeping 0  Tired, decreased energy 0  Change in appetite 0  Feeling bad or failure about yourself  0  Trouble concentrating 0  Moving slowly or fidgety/restless 0    Suicidal thoughts 0  PHQ-9 Score 0   ROS: Per HPI, otherwise a complete review of systems was negative.   PMH:  The following were reviewed and entered/updated in epic: History reviewed. No pertinent past medical history. Patient Active Problem List   Diagnosis Date Noted  . Tinea versicolor 05/26/2019  . Osteoarthritis of right shoulder 04/25/2017   History reviewed. No pertinent surgical history.  History reviewed. No pertinent family history.  Medications- reviewed and updated Current Outpatient Medications  Medication Sig Dispense Refill  . ketoconazole (NIZORAL) 2 % shampoo Apply 1 application topically 2 (two) times a week. 120 mL 0   No current facility-administered medications for this visit.   Allergies-reviewed and updated No Known Allergies  Social History   Socioeconomic History  . Marital status: Married    Spouse name: Not on file  . Number of children: 4  . Years of education: Not on file  . Highest education level: Not on file  Occupational History  . Not on file  Tobacco Use  . Smoking status: Never Smoker  . Smokeless tobacco: Never Used  Substance and Sexual Activity  . Alcohol use: Yes  . Drug use: No  . Sexual activity: Yes    Partners: Female  Other Topics Concern  . Not on file  Social History Narrative  . Not on file   Social Determinants of Health   Financial Resource Strain:   . Difficulty of Paying Living Expenses:   Food Insecurity:   . Worried About Programme researcher, broadcasting/film/video in the Last Year:   . Barista in the Last Year:   Transportation Needs:   . Lack  of Transportation (Medical):   Marland Kitchen Lack of Transportation (Non-Medical):   Physical Activity:   . Days of Exercise per Week:   . Minutes of Exercise per Session:   Stress:   . Feeling of Stress :   Social Connections:   . Frequency of Communication with Friends and Family:   . Frequency of Social Gatherings with Friends and Family:   . Attends Religious Services:   .  Active Member of Clubs or Organizations:   . Attends Archivist Meetings:   Marland Kitchen Marital Status:         Objective:  Physical Exam: BP 118/70   Pulse 78   Temp 98.2 F (36.8 C) (Temporal)   Ht 6\' 2"  (1.88 m)   Wt 221 lb (100.2 kg)   SpO2 98%   BMI 28.37 kg/m   Body mass index is 28.37 kg/m. Wt Readings from Last 3 Encounters:  05/26/19 221 lb (100.2 kg)  09/27/17 204 lb 9.6 oz (92.8 kg)  06/06/17 208 lb 6.4 oz (94.5 kg)   Gen: NAD, resting comfortably HEENT: TMs normal bilaterally. OP clear. No thyromegaly noted.  CV: RRR with no murmurs appreciated Pulm: NWOB, CTAB with no crackles, wheezes, or rhonchi GI: Normal bowel sounds present. Soft, Nontender, Nondistended. MSK: no edema, cyanosis, or clubbing noted Skin: warm, dry. Tinea versicolor on torso.  Neuro: CN2-12 grossly intact. Strength 5/5 in upper and lower extremities. Reflexes symmetric and intact bilaterally.  Psych: Normal affect and thought content     Marielle Mantione M. Jerline Pain, MD 05/26/2019 3:19 PM

## 2019-06-29 ENCOUNTER — Other Ambulatory Visit: Payer: Self-pay | Admitting: Family Medicine

## 2019-07-03 ENCOUNTER — Encounter: Payer: Self-pay | Admitting: Family Medicine

## 2019-07-03 ENCOUNTER — Telehealth (INDEPENDENT_AMBULATORY_CARE_PROVIDER_SITE_OTHER): Payer: Managed Care, Other (non HMO) | Admitting: Family Medicine

## 2019-07-03 DIAGNOSIS — L255 Unspecified contact dermatitis due to plants, except food: Secondary | ICD-10-CM

## 2019-07-03 DIAGNOSIS — R21 Rash and other nonspecific skin eruption: Secondary | ICD-10-CM

## 2019-07-03 MED ORDER — TRIAMCINOLONE ACETONIDE 0.1 % EX CREA: 1.0000 "application " | TOPICAL_CREAM | Freq: Two times a day (BID) | CUTANEOUS | 0 refills | Status: DC

## 2019-07-03 NOTE — Progress Notes (Signed)
Virtual Visit via Video Note  I connected with Drew [name]  on 07/03/19 at 12:00 PM EDT by a video enabled telemedicine application and verified that I am speaking with the correct person using two identifiers.  Location patient: home, [place] Location provider:work or home office Persons participating in the virtual visit: patient, provider  I discussed the limitations of evaluation and management by telemedicine and the availability of in person appointments. The patient expressed understanding and agreed to proceed.   HPI:  Acute visit skin rash: -this started after clearing some limbs/trees a few days ago -he then developed an itchy vesicular rash on his L arm and on his legs -he does not have any lesions on his face -denies lesions near mucus membranes, eyes, SOB, or systemic symptoms -reports he is very susceptible to poison ivy and poison oak and had this many times in the past  ROS: See pertinent positives and negatives per HPI.  History reviewed. No pertinent past medical history.  History reviewed. No pertinent surgical history.  History reviewed. No pertinent family history.  SOCIAL HX: see hpi   Current Outpatient Medications:  .  ketoconazole (NIZORAL) 2 % shampoo, APPLY 1 APPLICATION TOPICALLY 2 (TWO) TIMES A WEEK., Disp: 120 mL, Rfl: 0 .  triamcinolone cream (KENALOG) 0.1 %, Apply 1 application topically 2 (two) times daily., Disp: 45 g, Rfl: 0  EXAM:  VITALS per patient if applicable:  GENERAL: alert, oriented, appears well and in no acute distress  HEENT: atraumatic, conjunttiva clear, no obvious abnormalities on inspection of external nose and ears  NECK: normal movements of the head and neck  LUNGS: on inspection no signs of respiratory distress, breathing rate appears normal, no obvious gross SOB, gasping or wheezing  CV: no obvious cyanosis  MS: moves all visible extremities without noticeable abnormality  SKIN: papulovesicular erythematous rash on the L  forearm in streaky distribution, erythematous streak on the L leg   PSYCH/NEURO: pleasant and cooperative, no obvious depression or anxiety, speech and thought processing grossly intact  ASSESSMENT AND PLAN:  Discussed the following assessment and plan:  Skin rash  Toxicodendron dermatitis  -we discussed possible serious and likely etiologies, options for evaluation and workup, limitations of telemedicine visit vs in person visit, treatment, treatment risks and precautions. Pt prefers to treat via telemedicine empirically rather then risking or undertaking an in person visit at this moment. Suspect toxicodendron dermititis as most likely etiology. Opted for treatment with topical steroid and antihistamine such as claritin or zyrtec for 2-3 weeks. Patient agrees to seek prompt in person care if worsening, lesions on face, new symptoms arise, or if is not improving with treatment.   I discussed the assessment and treatment plan with the patient. The patient was provided an opportunity to ask questions and all were answered. The patient agreed with the plan and demonstrated an understanding of the instructions.   The patient was advised to call back or seek an in-person evaluation if the symptoms worsen or if the condition fails to improve as anticipated.   Terressa Koyanagi, DO

## 2020-05-27 ENCOUNTER — Encounter: Payer: Self-pay | Admitting: Family Medicine

## 2020-05-27 ENCOUNTER — Other Ambulatory Visit: Payer: Self-pay

## 2020-05-27 ENCOUNTER — Ambulatory Visit (INDEPENDENT_AMBULATORY_CARE_PROVIDER_SITE_OTHER): Payer: Managed Care, Other (non HMO) | Admitting: Family Medicine

## 2020-05-27 VITALS — BP 112/72 | HR 79 | Temp 98.2°F | Ht 74.0 in | Wt 225.0 lb

## 2020-05-27 DIAGNOSIS — Z13220 Encounter for screening for lipoid disorders: Secondary | ICD-10-CM

## 2020-05-27 DIAGNOSIS — B360 Pityriasis versicolor: Secondary | ICD-10-CM

## 2020-05-27 DIAGNOSIS — Z6828 Body mass index (BMI) 28.0-28.9, adult: Secondary | ICD-10-CM | POA: Diagnosis not present

## 2020-05-27 DIAGNOSIS — E663 Overweight: Secondary | ICD-10-CM

## 2020-05-27 DIAGNOSIS — Z0001 Encounter for general adult medical examination with abnormal findings: Secondary | ICD-10-CM | POA: Diagnosis not present

## 2020-05-27 LAB — COMPREHENSIVE METABOLIC PANEL
ALT: 23 U/L (ref 0–53)
AST: 25 U/L (ref 0–37)
Albumin: 4.5 g/dL (ref 3.5–5.2)
Alkaline Phosphatase: 58 U/L (ref 39–117)
BUN: 14 mg/dL (ref 6–23)
CO2: 26 mEq/L (ref 19–32)
Calcium: 9.6 mg/dL (ref 8.4–10.5)
Chloride: 104 mEq/L (ref 96–112)
Creatinine, Ser: 1.05 mg/dL (ref 0.40–1.50)
GFR: 88.63 mL/min (ref >60.00)
Glucose, Bld: 86 mg/dL (ref 70–99)
Potassium: 4.8 mEq/L (ref 3.5–5.1)
Sodium: 139 mEq/L (ref 135–145)
Total Bilirubin: 0.9 mg/dL (ref 0.2–1.2)
Total Protein: 6.7 g/dL (ref 6.0–8.3)

## 2020-05-27 LAB — LIPID PANEL
Cholesterol: 172 mg/dL (ref 0–200)
HDL: 57.2 mg/dL (ref >39.00)
LDL Cholesterol: 106 mg/dL — ABNORMAL HIGH (ref 0–99)
NonHDL: 114.95
Total CHOL/HDL Ratio: 3
Triglycerides: 47 mg/dL (ref 0.0–149.0)
VLDL: 9.4 mg/dL (ref 0.0–40.0)

## 2020-05-27 LAB — CBC
HCT: 41.5 % (ref 39.0–52.0)
Hemoglobin: 14.3 g/dL (ref 13.0–17.0)
MCHC: 34.4 g/dL (ref 30.0–36.0)
MCV: 93.9 fl (ref 78.0–100.0)
Platelets: 226 10*3/uL (ref 150.0–400.0)
RBC: 4.42 Mil/uL (ref 4.22–5.81)
RDW: 13.1 % (ref 11.5–15.5)
WBC: 4.6 10*3/uL (ref 4.0–10.5)

## 2020-05-27 LAB — TSH: TSH: 1.91 u[IU]/mL (ref 0.35–4.50)

## 2020-05-27 NOTE — Progress Notes (Signed)
Please inform patient of the following:  Cholesterol up a bit since last year but otherwise everything else is stable.  Do not need to make any changes.  Would like for him to continue working on diet and exercise we can recheck in a year or so.  Okay to complete work  paperwork.

## 2020-05-27 NOTE — Progress Notes (Signed)
Chief Complaint:  Drew [name] is a 40 y.o. male who presents today for his annual comprehensive physical exam.    Assessment/Plan:  Chronic Problems Addressed Today: Tinea versicolor Stable with ketoconazole as needed.   Body mass index is 28.89 kg/m. / Overweight  BMI Metric Follow Up - 05/27/20 0904      BMI Metric Follow Up-Please document annually   BMI Metric Follow Up Education provided            Preventative Healthcare: Check requested labs for insurance today.  Patient Counseling(The following topics were reviewed and/or handout was given):  -Nutrition: Stressed importance of moderation in sodium/caffeine intake, saturated fat and cholesterol, caloric balance, sufficient intake of fresh fruits, vegetables, and fiber.  -Stressed the importance of regular exercise.   -Substance Abuse: Discussed cessation/primary prevention of tobacco, alcohol, or other drug use; driving or other dangerous activities under the influence; availability of treatment for abuse.   -Injury prevention: Discussed safety belts, safety helmets, smoke detector, smoking near bedding or upholstery.   -Sexuality: Discussed sexually transmitted diseases, partner selection, use of condoms, avoidance of unintended pregnancy and contraceptive alternatives.   -Dental health: Discussed importance of regular tooth brushing, flossing, and dental visits.  -Health maintenance and immunizations reviewed. Please refer to Health maintenance section.  Return to care in 1 year for next preventative visit.     Subjective:  HPI:  He has no acute complaints today.   Lifestyle Diet: Balanced.  Exercise: Trying to get 30 minutes per day.   Depression screen PHQ 2/9 05/27/2020  Decreased Interest 0  Down, Depressed, Hopeless 0  PHQ - 2 Score 0  Altered sleeping -  Tired, decreased energy -  Change in appetite -  Feeling bad or failure about yourself  -  Trouble concentrating -  Moving slowly or  fidgety/restless -  Suicidal thoughts -  PHQ-9 Score -    Health Maintenance Due  Topic Date Due  . Hepatitis C Screening  Never done     ROS: Per HPI, otherwise a complete review of systems was negative.   PMH:  The following were reviewed and entered/updated in epic: History reviewed. No pertinent past medical history. Patient Active Problem List   Diagnosis Date Noted  . Tinea versicolor 05/26/2019  . Osteoarthritis of right shoulder 04/25/2017   History reviewed. No pertinent surgical history.  History reviewed. No pertinent family history.  Medications- reviewed and updated Current Outpatient Medications  Medication Sig Dispense Refill  . ketoconazole (NIZORAL) 2 % shampoo APPLY 1 APPLICATION TOPICALLY 2 (TWO) TIMES A WEEK. 120 mL 0  . triamcinolone cream (KENALOG) 0.1 % Apply 1 application topically 2 (two) times daily. 45 g 0   No current facility-administered medications for this visit.    Allergies-reviewed and updated No Known Allergies  Social History   Socioeconomic History  . Marital status: Married    Spouse name: Not on file  . Number of children: 4  . Years of education: Not on file  . Highest education level: Not on file  Occupational History  . Not on file  Tobacco Use  . Smoking status: Never Smoker  . Smokeless tobacco: Never Used  Substance and Sexual Activity  . Alcohol use: Yes  . Drug use: No  . Sexual activity: Yes    Partners: Female  Other Topics Concern  . Not on file  Social History Narrative  . Not on file   Social Determinants of Health   Financial Resource Strain: Not  on file  Food Insecurity: Not on file  Transportation Needs: Not on file  Physical Activity: Not on file  Stress: Not on file  Social Connections: Not on file        Objective:  Physical Exam: BP 112/72   Pulse 79   Temp 98.2 F (36.8 C) (Temporal)   Ht 6\' 2"  (1.88 m)   Wt 225 lb (102.1 kg)   SpO2 100%   BMI 28.89 kg/m   Body mass index is  28.89 kg/m. Wt Readings from Last 3 Encounters:  05/27/20 225 lb (102.1 kg)  05/26/19 221 lb (100.2 kg)  09/27/17 204 lb 9.6 oz (92.8 kg)   Gen: NAD, resting comfortably HEENT: TMs normal bilaterally. OP clear. No thyromegaly noted.  CV: RRR with no murmurs appreciated Pulm: NWOB, CTAB with no crackles, wheezes, or rhonchi GI: Normal bowel sounds present. Soft, Nontender, Nondistended. MSK: no edema, cyanosis, or clubbing noted Skin: warm, dry Neuro: CN2-12 grossly intact. Strength 5/5 in upper and lower extremities. Reflexes symmetric and intact bilaterally.  Psych: Normal affect and thought content     [name] M. 09/29/17, MD 05/27/2020 9:04 AM

## 2020-05-27 NOTE — Patient Instructions (Signed)
It was very nice to see you today!  We will check blood work today.  Please continue working on diet and exercise.  I will see you back in year for your next annual physical.  Come back to see me sooner if needed.  Take care, Dr Jimmey Ralph  PLEASE NOTE:  If you had any lab tests please let us know if you have not heard back within a few days. You may see your results on mychart before we have a chance to review them but we will give you a call once they are reviewed by [place]. If we ordered any referrals today, please let us know if you have not heard from their office within the next week.   Please try these tips to maintain a healthy lifestyle:   Eat at least 3 REAL meals and 1-2 snacks per day.  Aim for no more than 5 hours between eating.  If you eat breakfast, please do so within one hour of getting up.    Each meal should contain half fruits/vegetables, one quarter protein, and one quarter carbs (no bigger than a computer mouse)   Cut down on sweet beverages. This includes juice, soda, and sweet tea.     Drink at least 1 glass of water with each meal and aim for at least 8 glasses per day   Exercise at least 150 minutes every week.    Preventive Care 42-52 Years Old, Male Preventive care refers to lifestyle choices and visits with your health care provider that can promote health and wellness. This includes:  A yearly physical exam. This is also called an annual wellness visit.  Regular dental and eye exams.  Immunizations.  Screening for certain conditions.  Healthy lifestyle choices, such as: ? Eating a healthy diet. ? Getting regular exercise. ? Not using drugs or products that contain nicotine and tobacco. ? Limiting alcohol use. What can I expect for my preventive care visit? Physical exam Your health care provider will check your:  Height and weight. These may be used to calculate your BMI (body mass index). BMI is a measurement that tells if you are at a healthy  weight.  Heart rate and blood pressure.  Body temperature.  Skin for abnormal spots. Counseling Your health care provider may ask you questions about your:  Past medical problems.  Family's medical history.  Alcohol, tobacco, and drug use.  Emotional well-being.  Home life and relationship well-being.  Sexual activity.  Diet, exercise, and sleep habits.  Work and work Astronomer.  Access to firearms. What immunizations do I need? Vaccines are usually given at various ages, according to a schedule. Your health care provider will recommend vaccines for you based on your age, medical history, and lifestyle or other factors, such as travel or where you work.   What tests do I need? Blood tests  Lipid and cholesterol levels. These may be checked every 5 years, or more often if you are over 21 years old.  Hepatitis C test.  Hepatitis B test. Screening  Lung cancer screening. You may have this screening every year starting at age 35 if you have a 30-pack-year history of smoking and currently smoke or have quit within the past 15 years.  Prostate cancer screening. Recommendations will vary depending on your family history and other risks.  Genital exam to check for testicular cancer or hernias.  Colorectal cancer screening. ? All adults should have this screening starting at age 53 and continuing until age  75. ? Your health care provider may recommend screening at age 80 if you are at increased risk. ? You will have tests every 1-10 years, depending on your results and the type of screening test.  Diabetes screening. ? This is done by checking your blood sugar (glucose) after you have not eaten for a while (fasting). ? You may have this done every 1-3 years.  STD (sexually transmitted disease) testing, if you are at risk. Follow these instructions at home: Eating and drinking  Eat a diet that includes fresh fruits and vegetables, whole grains, lean protein, and  low-fat dairy products.  Take vitamin and mineral supplements as recommended by your health care provider.  Do not drink alcohol if your health care provider tells you not to drink.  If you drink alcohol: ? Limit how much you have to 0-2 drinks a day. ? Be aware of how much alcohol is in your drink. In the [place] one drink equals one 12 oz bottle of beer (355 mL), one 5 oz glass of wine (148 mL), or one 1 oz glass of hard liquor (44 mL).   Lifestyle  Take daily care of your teeth and gums. Brush your teeth every morning and night with fluoride toothpaste. Floss one time each day.  Stay active. Exercise for at least 30 minutes 5 or more days each week.  Do not use any products that contain nicotine or tobacco, such as cigarettes, e-cigarettes, and chewing tobacco. If you need help quitting, ask your health care provider.  Do not use drugs.  If you are sexually active, practice safe sex. Use a condom or other form of protection to prevent STIs (sexually transmitted infections).  If told by your health care provider, take low-dose aspirin daily starting at age 42.  Find healthy ways to cope with stress, such as: ? Meditation, yoga, or listening to music. ? Journaling. ? Talking to a trusted person. ? Spending time with friends and family. Safety  Always wear your seat belt while driving or riding in a vehicle.  Do not drive: ? If you have been drinking alcohol. Do not ride with someone who has been drinking. ? When you are tired or distracted. ? While texting.  Wear a helmet and other protective equipment during sports activities.  If you have firearms in your house, make sure you follow all gun safety procedures. What's next?  Go to your health care provider once a year for an annual wellness visit.  Ask your health care provider how often you should have your eyes and teeth checked.  Stay up to date on all vaccines. This information is not intended to replace advice  given to you by your health care provider. Make sure you discuss any questions you have with your health care provider. Document Revised: 11/26/2018 Document Reviewed: 02/21/2018 Elsevier Patient Education  2021 [employer].

## 2020-05-27 NOTE — Assessment & Plan Note (Signed)
Stable with ketoconazole as needed.

## 2020-06-01 ENCOUNTER — Telehealth: Payer: Self-pay | Admitting: *Deleted

## 2020-06-01 NOTE — Telephone Encounter (Signed)
Phy form faxed and send for scan

## 2020-07-20 ENCOUNTER — Other Ambulatory Visit: Payer: Self-pay | Admitting: Family Medicine

## 2020-07-20 DIAGNOSIS — Z3169 Encounter for other general counseling and advice on procreation: Secondary | ICD-10-CM

## 2020-07-27 ENCOUNTER — Ambulatory Visit: Payer: Managed Care, Other (non HMO) | Admitting: Family Medicine

## 2021-02-01 ENCOUNTER — Telehealth: Payer: Self-pay

## 2021-02-01 NOTE — Telephone Encounter (Signed)
Patient notified need ov with PCP for urology referral and labs

## 2021-02-01 NOTE — Telephone Encounter (Signed)
Received a call from Drew [name] who states that Drew [name] was referred to [facility]. When they went to the appointment today, Drew [name] was turned away because he had new insurance. Patient was told by [facility] that Drew [name] needs to contact us for a new referral because we may have to file it with the correct insurance.

## 2021-02-07 ENCOUNTER — Telehealth: Payer: Self-pay

## 2021-02-07 NOTE — Telephone Encounter (Signed)
That is fine. What labs do they need?  Katina Degree. Jimmey Ralph, MD 02/07/2021 1:13 PM

## 2021-02-07 NOTE — Telephone Encounter (Signed)
Patient is scheduled   

## 2021-02-07 NOTE — Telephone Encounter (Signed)
Please schedule patient with PCP

## 2021-02-07 NOTE — Telephone Encounter (Signed)
Please advise 

## 2021-02-07 NOTE — Telephone Encounter (Signed)
Patient's wife is calling in stating [facility] is requiring him to have updated labs in order to be seen on 12/5. No available appointments until Monday 12/5, can patient just do lab work versus coming in for an OV.

## 2021-02-11 ENCOUNTER — Other Ambulatory Visit: Payer: Self-pay

## 2021-02-11 ENCOUNTER — Ambulatory Visit: Payer: 59 | Admitting: Family Medicine

## 2021-02-11 ENCOUNTER — Encounter: Payer: Self-pay | Admitting: Family Medicine

## 2021-02-11 VITALS — BP 124/80 | HR 87 | Temp 98.4°F | Ht 74.0 in | Wt 214.2 lb

## 2021-02-11 DIAGNOSIS — E785 Hyperlipidemia, unspecified: Secondary | ICD-10-CM

## 2021-02-11 DIAGNOSIS — N469 Male infertility, unspecified: Secondary | ICD-10-CM | POA: Diagnosis not present

## 2021-02-11 NOTE — Patient Instructions (Signed)
It was very nice to see you today!  We will put in referral for urology.  We will see back in March.  Please come back sooner if needed.  Take care, Dr Jimmey Ralph  PLEASE NOTE:  If you had any lab tests please let us know if you have not heard back within a few days. You may see your results on mychart before we have a chance to review them but we will give you a call once they are reviewed by [place]. If we ordered any referrals today, please let us know if you have not heard from their office within the next week.   Please try these tips to maintain a healthy lifestyle:  Eat at least 3 REAL meals and 1-2 snacks per day.  Aim for no more than 5 hours between eating.  If you eat breakfast, please do so within one hour of getting up.   Each meal should contain half fruits/vegetables, one quarter protein, and one quarter carbs (no bigger than a computer mouse)  Cut down on sweet beverages. This includes juice, soda, and sweet tea.   Drink at least 1 glass of water with each meal and aim for at least 8 glasses per day  Exercise at least 150 minutes every week.

## 2021-02-11 NOTE — Progress Notes (Signed)
   Drew [name] is a 40 y.o. male who presents today for an office visit.  Assessment/Plan:  New/Acute Problems: Infertility s/p vasectomy reversal Will place referral to urology.   Chronic Problems Addressed Today: Dyslipidemia Continue lifestyle modifications.  Check lipids next blood draw.    Subjective:  HPI:  Patient here with concerns for infertility.  They have been trying to conceive a child for the last couple of years have not been successful.  Patient underwent vasectomy reversal about 3 years ago.  They were able to conceive a child shortly after this.  His wife has been following with her OB/GYN.  They have requested that if he have a semen analysis performed.        Objective:  Physical Exam: BP 124/80   Pulse 87   Temp 98.4 F (36.9 C) (Temporal)   Ht 6\' 2"  (1.88 m)   Wt 214 lb 3.2 oz (97.2 kg)   SpO2 98%   BMI 27.50 kg/m   Gen: No acute distress, resting comfortably Neuro: Grossly normal, moves all extremities Psych: Normal affect and thought content      Laurel Smeltz M. , MD 02/11/2021 4:06 PM

## 2021-02-11 NOTE — Assessment & Plan Note (Signed)
Continue lifestyle modifications.  Check lipids next blood draw. 

## 2021-05-31 ENCOUNTER — Encounter: Payer: Managed Care, Other (non HMO) | Admitting: Family Medicine

## 2021-12-05 ENCOUNTER — Encounter: Payer: Self-pay | Admitting: *Deleted

## 2022-02-23 ENCOUNTER — Encounter: Payer: Self-pay | Admitting: *Deleted

## 2022-05-08 ENCOUNTER — Ambulatory Visit (INDEPENDENT_AMBULATORY_CARE_PROVIDER_SITE_OTHER): Payer: 59 | Admitting: Family Medicine

## 2022-05-08 ENCOUNTER — Encounter: Payer: Self-pay | Admitting: Family Medicine

## 2022-05-08 VITALS — BP 110/70 | HR 71 | Temp 97.5°F | Ht 74.0 in | Wt 233.4 lb

## 2022-05-08 DIAGNOSIS — Z0001 Encounter for general adult medical examination with abnormal findings: Secondary | ICD-10-CM

## 2022-05-08 DIAGNOSIS — M1990 Unspecified osteoarthritis, unspecified site: Secondary | ICD-10-CM | POA: Diagnosis not present

## 2022-05-08 DIAGNOSIS — E785 Hyperlipidemia, unspecified: Secondary | ICD-10-CM | POA: Diagnosis not present

## 2022-05-08 DIAGNOSIS — B360 Pityriasis versicolor: Secondary | ICD-10-CM

## 2022-05-08 MED ORDER — SELENIUM SULFIDE 2.3 % EX SHAM: MEDICATED_SHAMPOO | CUTANEOUS | 3 refills | Status: AC

## 2022-05-08 NOTE — Assessment & Plan Note (Signed)
He is working on lifestyle modifications.  Deferred checking labs today.  We can recheck again in a couple of years.

## 2022-05-08 NOTE — Assessment & Plan Note (Signed)
Still having quite a bit of pain in several joints his knees.  We discussed over-the-counter supplements including glucosamine-chondroitin and turmeric.  Would like to avoid medications if possible.  Placed referral to physical therapy.

## 2022-05-08 NOTE — Patient Instructions (Signed)
It was very nice to see you today!  Please let me know if you would like to have an ultrasound of his bladder neck.  I will refer you to see a physical therapist to help with your joint pain.  I will refill the shampoo for the tinea versicolor.  Please continue to work on diet and exercise.  Will see back in year for your next physical.  Please come back to see [place] sooner if needed.  Take care, Dr Jerline Pain  PLEASE NOTE:  If you had any lab tests, please let us know if you have not heard back within a few days. You may see your results on mychart before we have a chance to review them but we will give you a call once they are reviewed by [place].   If we ordered any referrals today, please let us know if you have not heard from their office within the next week.   If you had any urgent prescriptions sent in today, please check with the pharmacy within an hour of our visit to make sure the prescription was transmitted appropriately.   Please try these tips to maintain a healthy lifestyle:  Eat at least 3 REAL meals and 1-2 snacks per day.  Aim for no more than 5 hours between eating.  If you eat breakfast, please do so within one hour of getting up.   Each meal should contain half fruits/vegetables, one quarter protein, and one quarter carbs (no bigger than a computer mouse)  Cut down on sweet beverages. This includes juice, soda, and sweet tea.   Drink at least 1 glass of water with each meal and aim for at least 8 glasses per day  Exercise at least 150 minutes every week.    Preventive Care 38-62 Years Old, Male Preventive care refers to lifestyle choices and visits with your health care provider that can promote health and wellness. Preventive care visits are also called wellness exams. What can I expect for my preventive care visit? Counseling During your preventive care visit, your health care provider may ask about your: Medical history, including: Past medical problems. Family  medical history. Current health, including: Emotional well-being. Home life and relationship well-being. Sexual activity. Lifestyle, including: Alcohol, nicotine or tobacco, and drug use. Access to firearms. Diet, exercise, and sleep habits. Safety issues such as seatbelt and bike helmet use. Sunscreen use. Work and work Statistician. Physical exam Your health care provider may check your: Height and weight. These may be used to calculate your BMI (body mass index). BMI is a measurement that tells if you are at a healthy weight. Waist circumference. This measures the distance around your waistline. This measurement also tells if you are at a healthy weight and may help predict your risk of certain diseases, such as type 2 diabetes and high blood pressure. Heart rate and blood pressure. Body temperature. Skin for abnormal spots. What immunizations do I need?  Vaccines are usually given at various ages, according to a schedule. Your health care provider will recommend vaccines for you based on your age, medical history, and lifestyle or other factors, such as travel or where you work. What tests do I need? Screening Your health care provider may recommend screening tests for certain conditions. This may include: Lipid and cholesterol levels. Diabetes screening. This is done by checking your blood sugar (glucose) after you have not eaten for a while (fasting). Hepatitis B test. Hepatitis C test. HIV (human immunodeficiency virus) test. STI (sexually  transmitted infection) testing, if you are at risk. Talk with your health care provider about your test results, treatment options, and if necessary, the need for more tests. Follow these instructions at home: Eating and drinking  Eat a healthy diet that includes fresh fruits and vegetables, whole grains, lean protein, and low-fat dairy products. Drink enough fluid to keep your urine pale yellow. Take vitamin and mineral supplements as  recommended by your health care provider. Do not drink alcohol if your health care provider tells you not to drink. If you drink alcohol: Limit how much you have to 0-2 drinks a day. Know how much alcohol is in your drink. In the [place] one drink equals one 12 oz bottle of beer (355 mL), one 5 oz glass of wine (148 mL), or one 1 oz glass of hard liquor (44 mL). Lifestyle Brush your teeth every morning and night with fluoride toothpaste. Floss one time each day. Exercise for at least 30 minutes 5 or more days each week. Do not use any products that contain nicotine or tobacco. These products include cigarettes, chewing tobacco, and vaping devices, such as e-cigarettes. If you need help quitting, ask your health care provider. Do not use drugs. If you are sexually active, practice safe sex. Use a condom or other form of protection to prevent STIs. Find healthy ways to manage stress, such as: Meditation, yoga, or listening to music. Journaling. Talking to a trusted person. Spending time with friends and family. Minimize exposure to UV radiation to reduce your risk of skin cancer. Safety Always wear your seat belt while driving or riding in a vehicle. Do not drive: If you have been drinking alcohol. Do not ride with someone who has been drinking. If you have been using any mind-altering substances or drugs. While texting. When you are tired or distracted. Wear a helmet and other protective equipment during sports activities. If you have firearms in your house, make sure you follow all gun safety procedures. Seek help if you have been physically or sexually abused. What's next? Go to your health care provider once a year for an annual wellness visit. Ask your health care provider how often you should have your eyes and teeth checked. Stay up to date on all vaccines. This information is not intended to replace advice given to you by your health care provider. Make sure you discuss any  questions you have with your health care provider. Document Revised: 08/25/2020 Document Reviewed: 08/25/2020 Elsevier Patient Education  [place].

## 2022-05-08 NOTE — Assessment & Plan Note (Signed)
Overall stable.  Will refill his selenium sulfide shampoo.  He has had this in the past and has done very well with this.

## 2022-05-08 NOTE — Progress Notes (Signed)
A  Chief Complaint:  Drew [name] is a 42 y.o. male who presents today for his annual comprehensive physical exam.    Assessment/Plan:  New/Acute Problems: Neck [name] Small tender nodule noted on distal tendon of SCM.  May be a sequela of recent neck strain.  We did discuss checking an ultrasound to confirm this and also rule out lymphadenopathy other potential causes.  He would like to defer for now.  We discussed reasons return to care.  Does not have any other red flag signs or symptoms.  He will let me know if not improving over the next several weeks and we can proceed with ultrasound.  Chronic Problems Addressed Today: Osteoarthritis Still having quite a bit of [name] in several joints his knees.  We discussed over-the-counter supplements including glucosamine-chondroitin and turmeric.  Would like to avoid medications if possible.  Placed referral to physical therapy.  Dyslipidemia He is working on lifestyle modifications.  Deferred checking labs today.  We can recheck again in a couple of years.  Tinea versicolor Overall stable.  Will refill his selenium sulfide shampoo.  He has had this in the past and has done very well with this.  Preventative Healthcare: Flu vaccines declined.   Patient Counseling(The following topics were reviewed and/or handout was given):  -Nutrition: Stressed importance of moderation in sodium/caffeine intake, saturated fat and cholesterol, caloric balance, sufficient intake of fresh fruits, vegetables, and fiber.  -Stressed the importance of regular exercise.   -Substance Abuse: Discussed cessation/primary prevention of tobacco, alcohol, or other drug use; driving or other dangerous activities under the influence; availability of treatment for abuse.   -Injury prevention: Discussed safety belts, safety helmets, smoke detector, smoking near bedding or upholstery.   -Sexuality: Discussed sexually transmitted diseases, partner selection, use of condoms,  avoidance of unintended pregnancy and contraceptive alternatives.   -Dental health: Discussed importance of regular tooth brushing, flossing, and dental visits.  -Health maintenance and immunizations reviewed. Please refer to Health maintenance section.  Return to care in 1 year for next preventative visit.     Subjective:  HPI:  He has no acute complaints today.   He has a painful lump on left side of neck. This has been there for about 6 months. No change last few months. No obvious aggravating or alleviating factors. Painful to touch.  No fevers or chills.  No weight loss.  No night sweats.  Lifestyle Diet: Balanced. Cutting down on carbs.  Exercise: HIIT and cardio.      05/08/2022    9:01 AM  Depression screen PHQ 2/9  Decreased Interest 0  Down, Depressed, Hopeless 0  PHQ - 2 Score 0    There are no preventive care reminders to display for this patient.   ROS: Per HPI, otherwise a complete review of systems was negative.   PMH:  The following were reviewed and entered/updated in epic: History reviewed. No pertinent past medical history. Patient Active Problem List   Diagnosis Date Noted   Dyslipidemia 02/11/2021   Tinea versicolor 05/26/2019   Osteoarthritis 04/25/2017   History reviewed. No pertinent surgical history.  History reviewed. No pertinent family history.  Medications- reviewed and updated Current Outpatient Medications  Medication Sig Dispense Refill   Selenium Sulfide 2.3 % SHAM Apply to affected area for for 5-10 minutes and then wash off. 180 mL 3   No current facility-administered medications for this visit.   Allergies-reviewed and updated No Known Allergies  Social History   Socioeconomic History  Marital status: Married    Spouse name: Not on file   Number of children: 4   Years of education: Not on file   Highest education level: Not on file  Occupational History   Not on file  Tobacco Use   Smoking status: Never   Smokeless  tobacco: Never  Substance and Sexual Activity   Alcohol use: Yes   Drug use: No   Sexual activity: Yes    Partners: Female  Other Topics Concern   Not on file  Social History Narrative   Not on file   Social Determinants of Health   Financial Resource Strain: Not on file  Food Insecurity: Not on file  Transportation Needs: Not on file  Physical Activity: Not on file  Stress: Not on file  Social Connections: Not on file        Objective:  Physical Exam: BP 110/70   Pulse 71   Temp (!) 97.5 F (36.4 C) (Temporal)   Ht '6\' 2"'$  (1.88 m)   Wt 233 lb 6.4 oz (105.9 kg)   SpO2 97%   BMI 29.97 kg/m   Body mass index is 29.97 kg/m. Wt Readings from Last 3 Encounters:  05/08/22 233 lb 6.4 oz (105.9 kg)  02/11/21 214 lb 3.2 oz (97.2 kg)  05/27/20 225 lb (102.1 kg)   Gen: NAD, resting comfortably HEENT: TMs normal bilaterally. OP clear. No thyromegaly noted.  CV: RRR with no murmurs appreciated Pulm: NWOB, CTAB with no crackles, wheezes, or rhonchi GI: Normal bowel sounds present. Soft, Nontender, Nondistended. MSK: no edema, cyanosis, or clubbing noted - Neck: Small tender nodule approximately 3 to 4 mm in diameter on distal aspect of SCM muscle. Tender to palpation.  No other lymphadenopathy noted. Skin: warm, dry Neuro: CN2-12 grossly intact. Strength 5/5 in upper and lower extremities. Reflexes symmetric and intact bilaterally.  Psych: Normal affect and thought content     Drew [name] M. Drew Pain, MD 05/08/2022 9:33 AM

## 2022-06-23 ENCOUNTER — Ambulatory Visit (HOSPITAL_BASED_OUTPATIENT_CLINIC_OR_DEPARTMENT_OTHER): Payer: 59 | Admitting: Physical Therapy

## 2022-06-23 ENCOUNTER — Encounter (HOSPITAL_BASED_OUTPATIENT_CLINIC_OR_DEPARTMENT_OTHER): Payer: Self-pay
